# Patient Record
Sex: Female | Born: 1965 | Race: Black or African American | Hispanic: No | Smoking: Never smoker
Health system: Southern US, Community
[De-identification: ages and names within clinical notes are randomized; demographics above are authoritative.]

## PROBLEM LIST (undated history)

## (undated) DIAGNOSIS — R569 Unspecified convulsions: Secondary | ICD-10-CM

## (undated) DIAGNOSIS — F419 Anxiety disorder, unspecified: Secondary | ICD-10-CM

## (undated) DIAGNOSIS — G8929 Other chronic pain: Secondary | ICD-10-CM

## (undated) DIAGNOSIS — IMO0002 Reserved for concepts with insufficient information to code with codable children: Secondary | ICD-10-CM

## (undated) DIAGNOSIS — E119 Type 2 diabetes mellitus without complications: Secondary | ICD-10-CM

## (undated) DIAGNOSIS — M419 Scoliosis, unspecified: Secondary | ICD-10-CM

## (undated) DIAGNOSIS — K219 Gastro-esophageal reflux disease without esophagitis: Secondary | ICD-10-CM

## (undated) DIAGNOSIS — R011 Cardiac murmur, unspecified: Secondary | ICD-10-CM

## (undated) DIAGNOSIS — M797 Fibromyalgia: Secondary | ICD-10-CM

## (undated) DIAGNOSIS — I10 Essential (primary) hypertension: Secondary | ICD-10-CM

## (undated) DIAGNOSIS — R519 Headache, unspecified: Secondary | ICD-10-CM

## (undated) DIAGNOSIS — R51 Headache: Secondary | ICD-10-CM

## (undated) DIAGNOSIS — D649 Anemia, unspecified: Secondary | ICD-10-CM

## (undated) DIAGNOSIS — I639 Cerebral infarction, unspecified: Secondary | ICD-10-CM

## (undated) HISTORY — DX: Type 2 diabetes mellitus without complications: E11.9

## (undated) HISTORY — PX: ABDOMINAL HYSTERECTOMY: SHX81

## (undated) HISTORY — DX: Unspecified convulsions: R56.9

---

## 1995-03-27 DIAGNOSIS — I639 Cerebral infarction, unspecified: Secondary | ICD-10-CM

## 1995-03-27 HISTORY — DX: Cerebral infarction, unspecified: I63.9

## 1997-08-03 ENCOUNTER — Emergency Department (HOSPITAL_COMMUNITY): Admission: EM | Admit: 1997-08-03 | Discharge: 1997-08-03 | Payer: Self-pay | Admitting: Emergency Medicine

## 1997-10-07 ENCOUNTER — Inpatient Hospital Stay (HOSPITAL_COMMUNITY): Admission: AD | Admit: 1997-10-07 | Discharge: 1997-10-07 | Payer: Self-pay | Admitting: Obstetrics and Gynecology

## 1999-02-04 ENCOUNTER — Emergency Department (HOSPITAL_COMMUNITY): Admission: EM | Admit: 1999-02-04 | Discharge: 1999-02-04 | Payer: Self-pay | Admitting: Emergency Medicine

## 2001-12-14 ENCOUNTER — Emergency Department (HOSPITAL_COMMUNITY): Admission: EM | Admit: 2001-12-14 | Discharge: 2001-12-14 | Payer: Self-pay | Admitting: Emergency Medicine

## 2002-04-12 ENCOUNTER — Encounter: Payer: Self-pay | Admitting: Internal Medicine

## 2002-04-12 ENCOUNTER — Emergency Department (HOSPITAL_COMMUNITY): Admission: EM | Admit: 2002-04-12 | Discharge: 2002-04-12 | Payer: Self-pay | Admitting: Internal Medicine

## 2002-04-16 ENCOUNTER — Ambulatory Visit (HOSPITAL_COMMUNITY): Admission: RE | Admit: 2002-04-16 | Discharge: 2002-04-16 | Payer: Self-pay | Admitting: Internal Medicine

## 2002-05-15 ENCOUNTER — Emergency Department (HOSPITAL_COMMUNITY): Admission: EM | Admit: 2002-05-15 | Discharge: 2002-05-15 | Payer: Self-pay | Admitting: Emergency Medicine

## 2002-05-15 ENCOUNTER — Encounter: Payer: Self-pay | Admitting: Emergency Medicine

## 2003-02-10 ENCOUNTER — Inpatient Hospital Stay (HOSPITAL_COMMUNITY): Admission: EM | Admit: 2003-02-10 | Discharge: 2003-02-14 | Payer: Self-pay | Admitting: Emergency Medicine

## 2003-03-18 ENCOUNTER — Ambulatory Visit (HOSPITAL_COMMUNITY): Admission: RE | Admit: 2003-03-18 | Discharge: 2003-03-18 | Payer: Self-pay | Admitting: Neurology

## 2003-03-27 HISTORY — PX: LAPAROSCOPIC UNILATERAL SALPINGO OOPHERECTOMY: SHX5935

## 2003-05-03 ENCOUNTER — Ambulatory Visit (HOSPITAL_COMMUNITY): Admission: RE | Admit: 2003-05-03 | Discharge: 2003-05-03 | Payer: Self-pay | Admitting: Neurology

## 2003-09-02 ENCOUNTER — Ambulatory Visit (HOSPITAL_COMMUNITY): Admission: RE | Admit: 2003-09-02 | Discharge: 2003-09-02 | Payer: Self-pay | Admitting: General Surgery

## 2003-09-24 ENCOUNTER — Ambulatory Visit (HOSPITAL_COMMUNITY): Admission: RE | Admit: 2003-09-24 | Discharge: 2003-09-24 | Payer: Self-pay | Admitting: Family Medicine

## 2003-10-12 ENCOUNTER — Observation Stay (HOSPITAL_COMMUNITY): Admission: RE | Admit: 2003-10-12 | Discharge: 2003-10-14 | Payer: Self-pay | Admitting: General Surgery

## 2004-01-12 ENCOUNTER — Emergency Department (HOSPITAL_COMMUNITY): Admission: EM | Admit: 2004-01-12 | Discharge: 2004-01-12 | Payer: Self-pay | Admitting: Emergency Medicine

## 2004-07-19 ENCOUNTER — Emergency Department (HOSPITAL_COMMUNITY): Admission: EM | Admit: 2004-07-19 | Discharge: 2004-07-19 | Payer: Self-pay | Admitting: Emergency Medicine

## 2005-02-20 ENCOUNTER — Ambulatory Visit: Payer: Self-pay | Admitting: Psychiatry

## 2005-05-24 ENCOUNTER — Ambulatory Visit: Payer: Self-pay | Admitting: Family Medicine

## 2005-07-18 ENCOUNTER — Ambulatory Visit (HOSPITAL_COMMUNITY): Admission: RE | Admit: 2005-07-18 | Discharge: 2005-07-18 | Payer: Self-pay | Admitting: Family Medicine

## 2005-07-18 ENCOUNTER — Ambulatory Visit: Payer: Self-pay | Admitting: Family Medicine

## 2005-07-24 ENCOUNTER — Ambulatory Visit: Payer: Self-pay | Admitting: Family Medicine

## 2005-07-30 ENCOUNTER — Ambulatory Visit (HOSPITAL_COMMUNITY): Admission: RE | Admit: 2005-07-30 | Discharge: 2005-07-30 | Payer: Self-pay | Admitting: Family Medicine

## 2005-08-26 ENCOUNTER — Emergency Department (HOSPITAL_COMMUNITY): Admission: EM | Admit: 2005-08-26 | Discharge: 2005-08-26 | Payer: Self-pay | Admitting: Emergency Medicine

## 2005-08-29 ENCOUNTER — Encounter: Admission: RE | Admit: 2005-08-29 | Discharge: 2005-08-29 | Payer: Self-pay | Admitting: Family Medicine

## 2005-10-05 ENCOUNTER — Emergency Department (HOSPITAL_COMMUNITY): Admission: EM | Admit: 2005-10-05 | Discharge: 2005-10-05 | Payer: Self-pay | Admitting: Family Medicine

## 2005-11-11 ENCOUNTER — Emergency Department (HOSPITAL_COMMUNITY): Admission: EM | Admit: 2005-11-11 | Discharge: 2005-11-11 | Payer: Self-pay | Admitting: Emergency Medicine

## 2006-05-22 ENCOUNTER — Ambulatory Visit (HOSPITAL_COMMUNITY): Payer: Self-pay | Admitting: Psychiatry

## 2006-05-25 ENCOUNTER — Emergency Department (HOSPITAL_COMMUNITY): Admission: EM | Admit: 2006-05-25 | Discharge: 2006-05-26 | Payer: Self-pay | Admitting: Emergency Medicine

## 2006-06-19 ENCOUNTER — Ambulatory Visit (HOSPITAL_COMMUNITY): Payer: Self-pay | Admitting: Psychiatry

## 2006-07-18 ENCOUNTER — Ambulatory Visit (HOSPITAL_COMMUNITY): Admission: RE | Admit: 2006-07-18 | Discharge: 2006-07-18 | Payer: Self-pay | Admitting: Cardiology

## 2006-07-18 ENCOUNTER — Encounter (INDEPENDENT_AMBULATORY_CARE_PROVIDER_SITE_OTHER): Payer: Self-pay | Admitting: Cardiology

## 2006-09-04 ENCOUNTER — Emergency Department (HOSPITAL_COMMUNITY): Admission: EM | Admit: 2006-09-04 | Discharge: 2006-09-04 | Payer: Self-pay | Admitting: Family Medicine

## 2006-10-02 ENCOUNTER — Ambulatory Visit (HOSPITAL_COMMUNITY): Admission: RE | Admit: 2006-10-02 | Discharge: 2006-10-02 | Payer: Self-pay | Admitting: Cardiology

## 2007-01-02 ENCOUNTER — Emergency Department (HOSPITAL_COMMUNITY): Admission: EM | Admit: 2007-01-02 | Discharge: 2007-01-02 | Payer: Self-pay | Admitting: Emergency Medicine

## 2007-04-30 ENCOUNTER — Emergency Department (HOSPITAL_COMMUNITY): Admission: EM | Admit: 2007-04-30 | Discharge: 2007-04-30 | Payer: Self-pay | Admitting: Emergency Medicine

## 2007-05-06 ENCOUNTER — Emergency Department (HOSPITAL_COMMUNITY): Admission: EM | Admit: 2007-05-06 | Discharge: 2007-05-06 | Payer: Self-pay | Admitting: Emergency Medicine

## 2007-05-14 ENCOUNTER — Emergency Department (HOSPITAL_COMMUNITY): Admission: EM | Admit: 2007-05-14 | Discharge: 2007-05-14 | Payer: Self-pay | Admitting: Emergency Medicine

## 2007-05-15 ENCOUNTER — Encounter: Admission: RE | Admit: 2007-05-15 | Discharge: 2007-05-15 | Payer: Self-pay | Admitting: Orthopedic Surgery

## 2007-05-17 ENCOUNTER — Emergency Department (HOSPITAL_COMMUNITY): Admission: EM | Admit: 2007-05-17 | Discharge: 2007-05-17 | Payer: Self-pay | Admitting: Emergency Medicine

## 2007-06-10 ENCOUNTER — Encounter: Payer: Self-pay | Admitting: Pulmonary Disease

## 2007-07-08 ENCOUNTER — Encounter: Admission: RE | Admit: 2007-07-08 | Discharge: 2007-08-14 | Payer: Self-pay | Admitting: Orthopedic Surgery

## 2007-08-14 ENCOUNTER — Emergency Department (HOSPITAL_COMMUNITY): Admission: EM | Admit: 2007-08-14 | Discharge: 2007-08-14 | Payer: Self-pay | Admitting: Family Medicine

## 2007-10-07 ENCOUNTER — Ambulatory Visit (HOSPITAL_COMMUNITY): Admission: RE | Admit: 2007-10-07 | Discharge: 2007-10-07 | Payer: Self-pay | Admitting: Cardiology

## 2007-12-03 ENCOUNTER — Ambulatory Visit: Payer: Self-pay | Admitting: Internal Medicine

## 2007-12-03 DIAGNOSIS — G40309 Generalized idiopathic epilepsy and epileptic syndromes, not intractable, without status epilepticus: Secondary | ICD-10-CM | POA: Insufficient documentation

## 2007-12-03 DIAGNOSIS — IMO0001 Reserved for inherently not codable concepts without codable children: Secondary | ICD-10-CM | POA: Insufficient documentation

## 2007-12-17 ENCOUNTER — Ambulatory Visit: Payer: Self-pay | Admitting: Internal Medicine

## 2007-12-17 DIAGNOSIS — G4730 Sleep apnea, unspecified: Secondary | ICD-10-CM

## 2007-12-17 DIAGNOSIS — M549 Dorsalgia, unspecified: Secondary | ICD-10-CM | POA: Insufficient documentation

## 2007-12-29 ENCOUNTER — Telehealth: Payer: Self-pay | Admitting: Internal Medicine

## 2008-01-12 ENCOUNTER — Encounter: Payer: Self-pay | Admitting: Internal Medicine

## 2008-03-16 ENCOUNTER — Ambulatory Visit: Payer: Self-pay | Admitting: Internal Medicine

## 2008-04-01 ENCOUNTER — Ambulatory Visit: Payer: Self-pay | Admitting: Pulmonary Disease

## 2008-04-01 DIAGNOSIS — R51 Headache: Secondary | ICD-10-CM

## 2008-04-01 DIAGNOSIS — R519 Headache, unspecified: Secondary | ICD-10-CM | POA: Insufficient documentation

## 2008-04-01 DIAGNOSIS — I6350 Cerebral infarction due to unspecified occlusion or stenosis of unspecified cerebral artery: Secondary | ICD-10-CM | POA: Insufficient documentation

## 2008-04-01 DIAGNOSIS — G4700 Insomnia, unspecified: Secondary | ICD-10-CM

## 2008-04-13 ENCOUNTER — Encounter: Payer: Self-pay | Admitting: Internal Medicine

## 2008-04-19 ENCOUNTER — Encounter: Admission: RE | Admit: 2008-04-19 | Discharge: 2008-04-19 | Payer: Self-pay | Admitting: Neurology

## 2008-04-22 ENCOUNTER — Encounter: Admission: RE | Admit: 2008-04-22 | Discharge: 2008-07-22 | Payer: Self-pay | Admitting: Internal Medicine

## 2008-05-28 ENCOUNTER — Ambulatory Visit: Payer: Self-pay | Admitting: Internal Medicine

## 2008-05-28 DIAGNOSIS — K219 Gastro-esophageal reflux disease without esophagitis: Secondary | ICD-10-CM

## 2008-06-07 ENCOUNTER — Encounter: Payer: Self-pay | Admitting: Internal Medicine

## 2008-06-10 ENCOUNTER — Telehealth: Payer: Self-pay | Admitting: Internal Medicine

## 2008-06-11 ENCOUNTER — Ambulatory Visit: Payer: Self-pay | Admitting: Internal Medicine

## 2008-06-11 DIAGNOSIS — R197 Diarrhea, unspecified: Secondary | ICD-10-CM

## 2008-06-14 ENCOUNTER — Encounter: Payer: Self-pay | Admitting: Internal Medicine

## 2008-07-01 ENCOUNTER — Ambulatory Visit: Payer: Self-pay | Admitting: Internal Medicine

## 2008-07-01 DIAGNOSIS — N390 Urinary tract infection, site not specified: Secondary | ICD-10-CM

## 2008-07-01 LAB — CONVERTED CEMR LAB
Leukocytes, UA: NEGATIVE
Nitrite: NEGATIVE
Specific Gravity, Urine: >=1.03 (ref 1.000–1.030)
Urobilinogen, UA: 0.2 (ref 0.0–1.0)
pH: 5.5 (ref 5.0–8.0)

## 2008-10-12 ENCOUNTER — Encounter: Admission: RE | Admit: 2008-10-12 | Discharge: 2008-10-12 | Payer: Self-pay | Admitting: Family Medicine

## 2008-10-14 ENCOUNTER — Encounter: Admission: RE | Admit: 2008-10-14 | Discharge: 2008-10-14 | Payer: Self-pay | Admitting: Specialist

## 2008-10-20 ENCOUNTER — Encounter: Admission: RE | Admit: 2008-10-20 | Discharge: 2008-10-20 | Payer: Self-pay | Admitting: Specialist

## 2008-10-31 ENCOUNTER — Emergency Department (HOSPITAL_COMMUNITY): Admission: EM | Admit: 2008-10-31 | Discharge: 2008-11-01 | Payer: Self-pay | Admitting: Emergency Medicine

## 2008-11-17 ENCOUNTER — Encounter: Admission: RE | Admit: 2008-11-17 | Discharge: 2008-12-20 | Payer: Self-pay | Admitting: Specialist

## 2009-03-01 ENCOUNTER — Ambulatory Visit (HOSPITAL_COMMUNITY): Admission: RE | Admit: 2009-03-01 | Discharge: 2009-03-01 | Payer: Self-pay | Admitting: Family Medicine

## 2009-03-28 ENCOUNTER — Emergency Department (HOSPITAL_COMMUNITY): Admission: EM | Admit: 2009-03-28 | Discharge: 2009-03-28 | Payer: Self-pay | Admitting: Family Medicine

## 2009-04-18 ENCOUNTER — Emergency Department (HOSPITAL_COMMUNITY): Admission: EM | Admit: 2009-04-18 | Discharge: 2009-04-18 | Payer: Self-pay | Admitting: Emergency Medicine

## 2009-09-02 ENCOUNTER — Emergency Department (HOSPITAL_COMMUNITY): Admission: EM | Admit: 2009-09-02 | Discharge: 2009-09-02 | Payer: Self-pay | Admitting: Emergency Medicine

## 2009-12-04 ENCOUNTER — Emergency Department (HOSPITAL_COMMUNITY): Admission: EM | Admit: 2009-12-04 | Discharge: 2009-12-04 | Payer: Self-pay | Admitting: Emergency Medicine

## 2010-02-13 ENCOUNTER — Encounter: Admission: RE | Admit: 2010-02-13 | Discharge: 2010-02-13 | Payer: Self-pay | Admitting: Family Medicine

## 2010-02-19 ENCOUNTER — Emergency Department (HOSPITAL_COMMUNITY): Admission: EM | Admit: 2010-02-19 | Discharge: 2010-02-19 | Payer: Self-pay | Admitting: Emergency Medicine

## 2010-03-02 ENCOUNTER — Encounter: Admission: RE | Admit: 2010-03-02 | Payer: Self-pay | Source: Home / Self Care | Admitting: Family Medicine

## 2010-03-02 ENCOUNTER — Ambulatory Visit (HOSPITAL_COMMUNITY)
Admission: RE | Admit: 2010-03-02 | Discharge: 2010-03-02 | Payer: Self-pay | Source: Home / Self Care | Attending: Family Medicine | Admitting: Family Medicine

## 2010-04-17 ENCOUNTER — Encounter: Payer: Self-pay | Admitting: Specialist

## 2010-06-08 LAB — URINALYSIS, ROUTINE W REFLEX MICROSCOPIC
Glucose, UA: NEGATIVE mg/dL
Hgb urine dipstick: NEGATIVE
Ketones, ur: NEGATIVE mg/dL
Protein, ur: NEGATIVE mg/dL
pH: 6 (ref 5.0–8.0)

## 2010-06-11 LAB — URINALYSIS, ROUTINE W REFLEX MICROSCOPIC
Glucose, UA: 250 mg/dL — AB
Ketones, ur: >80 mg/dL — AB
Nitrite: POSITIVE — AB
Specific Gravity, Urine: 1.023 (ref 1.005–1.030)
pH: 5 (ref 5.0–8.0)

## 2010-06-11 LAB — URINE MICROSCOPIC-ADD ON

## 2010-07-01 LAB — CBC
HCT: 37.2 % (ref 36.0–46.0)
Platelets: 282 10*3/uL (ref 150–400)
WBC: 14.4 10*3/uL — ABNORMAL HIGH (ref 4.0–10.5)

## 2010-07-01 LAB — URINALYSIS, ROUTINE W REFLEX MICROSCOPIC
Glucose, UA: NEGATIVE mg/dL
Protein, ur: NEGATIVE mg/dL

## 2010-07-01 LAB — DIFFERENTIAL
Eosinophils Relative: 1 % (ref 0–5)
Lymphocytes Relative: 27 % (ref 12–46)
Lymphs Abs: 4 10*3/uL (ref 0.7–4.0)
Neutrophils Relative %: 69 % (ref 43–77)

## 2010-07-01 LAB — POCT I-STAT, CHEM 8
BUN: 13 mg/dL (ref 6–23)
Chloride: 102 mEq/L (ref 96–112)
Creatinine, Ser: 1 mg/dL (ref 0.4–1.2)
Potassium: 4.1 mEq/L (ref 3.5–5.1)
Sodium: 136 mEq/L (ref 135–145)

## 2010-08-10 ENCOUNTER — Other Ambulatory Visit: Payer: Self-pay | Admitting: Obstetrics and Gynecology

## 2010-08-10 ENCOUNTER — Encounter: Payer: Medicare Other | Admitting: Obstetrics and Gynecology

## 2010-08-10 DIAGNOSIS — Z01419 Encounter for gynecological examination (general) (routine) without abnormal findings: Secondary | ICD-10-CM

## 2010-08-10 DIAGNOSIS — Z124 Encounter for screening for malignant neoplasm of cervix: Secondary | ICD-10-CM

## 2010-08-10 DIAGNOSIS — Z113 Encounter for screening for infections with a predominantly sexual mode of transmission: Secondary | ICD-10-CM

## 2010-08-11 NOTE — Discharge Summary (Signed)
NAME:  Helen [name], Helen [name]                          ACCOUNT NO.:  [id number]   MEDICAL RECORD NO.:  [id number]                   PATIENT TYPE:  INP   LOCATION:  A339                                 FACILITY:  [facility]   PHYSICIAN:  Tesfaye D. Felecia Shelling, M.D.              DATE OF BIRTH:  03-25-1966   DATE OF ADMISSION:  02/10/2003  DATE OF DISCHARGE:  02/14/2003                                 DISCHARGE SUMMARY   DISCHARGE DIAGNOSES:  1. Probably new cerebrovascular accident with left-sided weakness and     numbness.  2. History of previous cerebrovascular accident.  3. Migraine headaches.   DISCHARGE MEDICATIONS:  1. Lortab 5/500 one tablet p.o. q.6h. p.r.n.  2. Robaxin 750 mg p.o. t.i.d.   DISPOSITION:  The patient was discharged home in stable condition.   HOSPITAL COURSE:  This is a 45 year old female patient with a history of  previous CVA who came to the emergency room with complaint of left-sided  weakness and numbness.  The patient was admitted and evaluated by her  neurologist.  CT scan was negative.  There was a suspicion that she had a  mild new stroke.  The possibility of migraine headache also entertained.  The patient remained stable, and she was discharged home with  symptomatic  treatment and to be followed by her neurologist.     ___________________________________________                                         Eustaquio Maize. Felecia Shelling, M.D.   TDF/MEDQ  D:  03/23/2003  T:  03/23/2003  Job:  [id number]

## 2010-08-11 NOTE — H&P (Signed)
NAME:  Helen [name], Helen [name]                          ACCOUNT NO.:  [id number]   MEDICAL RECORD NO.:  [id number]                   PATIENT TYPE:  AMB   LOCATION:  DAY                                  FACILITY:  [facility]   PHYSICIAN:  Jerolyn Shin C. Katrinka Blazing, M.D.                DATE OF BIRTH:  [birth year]   DATE OF ADMISSION:  10/11/2003  DATE OF DISCHARGE:                                HISTORY & PHYSICAL   HISTORY OF PRESENT ILLNESS:  A 45 year old female with a history of  recurrent severe right lower quadrant pain with nausea. She had intermittent  vaginal spotting. She is status post hysterectomy but has retained ovaries.  Ultrasound shows normal left ovary with an enlarged right ovary, which  measures 4.5 x 3.6 x 3.6 cm with a 3.1 x 2.6 x 2.6 complex hypoechoic mass.  This mass is suspicious for neoplasm. She is scheduled for laparoscopy with  oophorectomy.   PAST MEDICAL HISTORY:  She has a history of a cerebrovascular accident in  79 and has recovered. There is a history of hypertension. Lumbar disk  disease. Anemia and recurrent severe headaches.   MEDICATIONS:  Maxalt p.r.n. for headaches, hydrochlorothiazide 12.5 mg q.d.  and Robaxin 750 mg t.i.d.   FAMILY HISTORY:  Positive for hypertension, diabetes mellitus, breast  cancer, and prostate cancer.   PHYSICAL EXAMINATION:  VITAL SIGNS:  Blood pressure 115/80, pulse 72,  respiratory rate 18, weight 222 pounds.  HEENT:  Unremarkable.  NECK:  Supple. No jugular venous distention. No bruit.  CHEST:  Clear to auscultation. No rales, rubs, or wheezes.  HEART:  Regular rate and rhythm. Without murmur, rub, or gallop.  ABDOMEN:  Soft with mild hypogastric tenderness.  EXTREMITIES:  No clubbing, cyanosis, or edema.  PELVIC:  Unremarkable except for tender mass in right adnexa.   IMPRESSION:  1. Right ovarian mass.  2. Hypertension.  3. Chronic headache.  4. Lumbar disk disease.   PLAN:  Laparoscopy with right oophorectomy.     ___________________________________________                                         Dirk Dress Katrinka Blazing, M.D.   LCS/MEDQ  D:  10/11/2003  T:  10/12/2003  Job:  [id number]

## 2010-08-11 NOTE — Consult Note (Signed)
[name], [name]                          ACCOUNT NO.:  [id number]   MEDICAL RECORD NO.:  [id number]                   PATIENT TYPE:  INP   LOCATION:  A339                                 FACILITY:  [facility]   PHYSICIAN:  Kofi A. Gerilyn Pilgrim, M.D.              DATE OF BIRTH:  03/15/1966   DATE OF CONSULTATION:  DATE OF DISCHARGE:                                   CONSULTATION   IMPRESSION:  I believe this is a case of patient who likely has multiple  diagnoses to explain her symptoms.  I believe she has carpal tunnel syndrome  on the left.  Additionally, she has significant low back pain with a  radicular quality to this, somewhat suggestive of a radiculopathy.  Her  other diagnosis is migraine headaches.  Although her symptoms are all on the  left side, her physical exam and MRI of the brain do not suggest a central  process.  I also do not see any indication of spinal cord dysfunction on  physical exam.   RECOMMENDATIONS:  Suggest that she use a hand brace on the left.  EMG also  will be set up in our office for possibly tomorrow or next week.  If she has  not improved in the legs within a month, EMG of the legs would be suggested  also.  Robaxin could be used for symptomatic.  Additionally, a nonsteroidal  anti-inflammatory medication could also be used.   HISTORY:  This is a 45 year old right-handed African-American lady who  apparently has a diagnosis of stroke which happened in 1996.  She  apparently at that time had similar symptoms to what she has today, with  paresthesias with numbness involving the left hand and left leg.  She was  symptomatic for two weeks before she sought medical attention.  On this bout  she apparently was lifting her son, who is apparently 50 pounds, when she  felt a sharp pain in her back.  The following day she noticed more back pain  which shot down the leg.  She also reports some numbness and tingling in the  left leg.  At the same time she  developed similar symptoms in the left upper  extremity starting in the left hand and progressing proximally.  It is not  entirely clear if she had similar symptoms in the face, but she does report  some twitching of the left facial region.  She reports that the main reason  why she came to the hospital was because of the pain in the low back region.  She requests symptomatic treatment for this.  The patient endorses having a  headache with her current symptoms.  It is located in the left periorbital  temporal region.  It is associated with some phonophobia but no photophobia,  no nausea, no emesis.  She does have some blurry vision with her headaches.  She does endorse tinnitus.  She also complains of problems with urination  over the past week or two.  She reports urinary urgency.   PAST MEDICAL HISTORY:  Essentially unremarkable other than stated in history  of present illness.   FAMILY HISTORY:  Positive for stroke.   SOCIAL HISTORY:  She has recently gone through a separation and is having  stress from that.   REVIEW OF SYSTEMS:  No speech impairments reported.  No fevers.  No chills.  Otherwise unremarkable other than stated in the history of present illness.   PHYSICAL EXAMINATION:  VITAL SIGNS:  Temperature 98.4, pulse 70,  respirations in the teens, blood pressure 102/64.  HEENT, NECK:  Supple.  GENERAL:  Shows an obese lady in no acute distress.  NEUROLOGIC:  She is awake.  She is alert.  She converses fluently,  coherently.  There is no language impairment.  There is no obvious cognitive  impairment.  Cranial nerve evaluation shows the pupils are 3-4 mm and  reactive.  Visual fields are intact.  Extraocular movements are full.  There  is no nystagmus.  Facial muscles are normal.  Facial sensation indicates  mild decreased sensation in the left facial region.  Trachea is midline.  Uvula is midline.  Shoulder shrugs are normal.  Motor examination shows mild  give-away  weakness, 5- to 4+/5, in the left leg.  Other extremities are  normal.  There is no pronator drift.  Coordination is unremarkable.  Reflexes are actually +3, but plantar reflexes are both downgoing.  Sensory  examination shows actually reduced sensation to light touch on her left  side.  Interestingly, however, there is reduced sensation on the right side  to temperature.  Position sense is normal.  Gait is slightly antalgic.   MRI of the brain showed no abnormalities, done without contrast.   Thank you for this consultation.      ___________________________________________                                            Perlie Gold Gerilyn Pilgrim, M.D.   KAD/MEDQ  D:  02/11/2003  T:  02/11/2003  Job:  [id number]

## 2010-08-11 NOTE — Op Note (Signed)
NAME:  Helen [name], Helen [name]                          ACCOUNT NO.:  [id number]   MEDICAL RECORD NO.:  [id number]                   PATIENT TYPE:  INP   LOCATION:  A420                                 FACILITY:  [facility]   PHYSICIAN:  Dirk Dress. Katrinka Blazing, M.D.                DATE OF BIRTH:  07/31/1965   DATE OF PROCEDURE:  DATE OF DISCHARGE:                                 OPERATIVE REPORT   PREOPERATIVE DIAGNOSIS:  Right ovarian mass.   POSTOPERATIVE DIAGNOSIS:  Right ovarian mass.   PROCEDURE:  Laparoscopic right salpingo-oophorectomy.   SURGEON:  Dirk Dress. Katrinka Blazing, M.D.   DESCRIPTION:  Under general anesthesia the patient's abdomen was prepped and  draped in a sterile field.  A supraumbilical midline incision was made and a  Veress needle was inserted uneventfully.  Abdomen was insufflated with 2.5  liters of CO2.  Using a Vis-A-Port guide a 10-mm port was placed  uneventfully.  There were some adhesions in the lower midline.  The patient  was placed in deep Trendelenburg position.  Under videoscopic guidance a 5-  mm port was placed in the lower suprapubic midline and a 12-mm port was  placed in the left lower quadrant.  The pelvis was clear except for a small  amount of serous fluid.  The right ovary was enlarged and there appeared to  be a hemorrhagic cyst extending from the right ovary.  The tube was inflamed  and enlarged and friable.  The left ovary was small, but was densely  involved in adhesions from the sigmoid colon.  The right ovary was grasped  with the grasping forceps.  Using electrocautery and blunt dissection the  ovarian tissue and the tube were separated from the right lateral pelvic  wall.  The vascular pedicle was identified.  It was dissected and then  transected using the endovascular GIA.  The remainder of the attachments to  the peritoneum were clipped with hemoclips and divided.  The ovary was  delivered in an EndoCatch device.   Evaluation revealed no evidence of  vascular injury.  There was no bleeding.  The pelvis was irrigated. CO2 was allowed to escape from the abdomen and the  ports were removed.  The supraumbilical port midline fascia was closed with  #0 Dexon.  The skin and subcutaneous tissue of the other ports were then  closed with staples. Dressings were placed.  The patient tolerated the  procedure well.  She was awakened from anesthesia transferred to a bed and  taken to the postanesthetic care unit for further monitoring.      ___________________________________________                                            Dirk Dress. Katrinka Blazing, M.D.   LCS/MEDQ  D:  10/12/2003  T:  10/12/2003  Job:  [id number]

## 2010-08-11 NOTE — Group Therapy Note (Signed)
NAME:  Helen [name], Helen [name] NO.:  [id number]  MEDICAL RECORD NO.:  [id number]           PATIENT TYPE:  A  LOCATION:  [facility]                   FACILITY:  [facility]  PHYSICIAN:  Argentina Donovan, MD        DATE OF BIRTH:  1965-05-30  DATE OF SERVICE:  08/10/2010                                 CLINIC NOTE  The patient is a 45 year old gravida 2, para 2-0-0-2 with children ages 75 and 23 who has been a patient of family practice at [facility] in for an annual examination.  She had a mammogram done in March 2012 which was normal.  She cannot remember when she last had a Pap smear.  She states she had a stroke in 1996.  Medication she is on Flexeril for fibromyalgia, etodolac antiinflammatory for painful joints and low-dose baby aspirin once a day.  Other than that she is on no medications.  Her blood pressure is 121/81.  She weighs 241 pounds.  She is 5 feet 3 inches tall.  She has no known allergies and she has had a hysterectomy done.  On examination, abdomen is soft, flat, nontender. No masses.  No organomegaly.  External genitalia is normal.  BUS is within normal limits.  Vagina is clean and well rugated.  The cervix is clean and parous.  Uterus and adnexa could not be palpated.  Pap smear was taken.  The patient desired to be checked for sexually transmitted disease.  Therefore, a wet prep, gonorrhea and Chlamydia probes were done and the blood work will be done for hepatitis B, C, HIV and RPR.  IMPRESSION:  Normal gynecological examination status hysterectomy.          ______________________________ Argentina Donovan, MD    PR/MEDQ  D:  08/10/2010  T:  08/11/2010  Job:  [id number]

## 2010-08-11 NOTE — Procedures (Signed)
Helen [name], Helen [name]                          ACCOUNT NO.:  [id number]   MEDICAL RECORD NO.:  [id number]                   PATIENT TYPE:  INP   LOCATION:  A339                                 FACILITY:  [facility]   PHYSICIAN:  Mora Bing, M.D.               DATE OF BIRTH:  October 29, 1965   DATE OF PROCEDURE:  02/11/2003  DATE OF DISCHARGE:                                  ECHOCARDIOGRAM   REFERRING PHYSICIAN:  1. Dr. Felecia Shelling.  2. Dr. Juanetta Gosling.   CLINICAL DATA:  69. A 45 year old woman with cerebrovascular accident.  2. Possible history of congenital heart disease.   M-MODE:  1. Aorta 2.9.  2. Left atrium 3.3.  3. Septum 1.0.  4. Posterior wall 0.8.  5. LV diastole 4.4.  6. LV systole 3.7.   FINDINGS:  1. Technically-adequate echocardiographic study.  2. Normal left atrium, right atrium and right ventricle.  3. Normal aorta, tricuspid and pulmonic valves.  4. Trivial tricuspid regurgitation.  5. Slight mitral valve thickening with minimal regurgitation.  6. Normal internal dimension of the left ventricle; no LVH.  7. Regional and global LV systolic function are low normal.  8. Normal inferior vena cava.  9. No Atrial septal defect or ventricular septal defect identified.  10.      Injection of agitated saline might be of benefit, if the     possibility of atrial septal defect is significant.      ___________________________________________                                            Ruston Bing, M.D.   RR/MEDQ  D:  02/11/2003  T:  02/12/2003  Job:  [id number]

## 2010-10-07 ENCOUNTER — Emergency Department (HOSPITAL_COMMUNITY)
Admission: EM | Admit: 2010-10-07 | Discharge: 2010-10-08 | Disposition: A | Discharge status: Home / Self Care | Payer: Medicare Other | Attending: Emergency Medicine | Admitting: Emergency Medicine

## 2010-10-07 DIAGNOSIS — R05 Cough: Secondary | ICD-10-CM | POA: Insufficient documentation

## 2010-10-07 DIAGNOSIS — E7800 Pure hypercholesterolemia, unspecified: Secondary | ICD-10-CM | POA: Insufficient documentation

## 2010-10-07 DIAGNOSIS — J40 Bronchitis, not specified as acute or chronic: Secondary | ICD-10-CM | POA: Insufficient documentation

## 2010-10-07 DIAGNOSIS — R011 Cardiac murmur, unspecified: Secondary | ICD-10-CM | POA: Insufficient documentation

## 2010-10-07 DIAGNOSIS — J9801 Acute bronchospasm: Secondary | ICD-10-CM | POA: Insufficient documentation

## 2010-10-07 DIAGNOSIS — IMO0001 Reserved for inherently not codable concepts without codable children: Secondary | ICD-10-CM | POA: Insufficient documentation

## 2010-10-07 DIAGNOSIS — R059 Cough, unspecified: Secondary | ICD-10-CM | POA: Insufficient documentation

## 2010-10-07 DIAGNOSIS — E669 Obesity, unspecified: Secondary | ICD-10-CM | POA: Insufficient documentation

## 2010-11-22 ENCOUNTER — Ambulatory Visit (HOSPITAL_COMMUNITY)
Admission: RE | Admit: 2010-11-22 | Discharge: 2010-11-22 | Disposition: A | Discharge status: Home / Self Care | Payer: Medicare Other | Source: Ambulatory Visit | Attending: Internal Medicine | Admitting: Internal Medicine

## 2010-11-22 ENCOUNTER — Other Ambulatory Visit (HOSPITAL_COMMUNITY): Payer: Self-pay | Admitting: Internal Medicine

## 2010-11-22 DIAGNOSIS — W19XXXA Unspecified fall, initial encounter: Secondary | ICD-10-CM

## 2010-11-22 DIAGNOSIS — M25559 Pain in unspecified hip: Secondary | ICD-10-CM | POA: Insufficient documentation

## 2010-12-15 LAB — POCT CARDIAC MARKERS
Myoglobin, poc: 84.1
Operator id: 257131
Operator id: 284251

## 2010-12-15 LAB — CBC
HCT: 36.7
Hemoglobin: 12.1
MCHC: 33.1
MCV: 83
RBC: 4.42

## 2010-12-15 LAB — I-STAT 8, (EC8 V) (CONVERTED LAB)
BUN: 11
Chloride: 106
HCT: 40
Operator id: 284251
Potassium: 4.4
pCO2, Ven: 44.3 — ABNORMAL LOW
pH, Ven: 7.361 — ABNORMAL HIGH

## 2010-12-15 LAB — DIFFERENTIAL
Basophils Relative: 1
Eosinophils Absolute: 0.1
Monocytes Absolute: 0.4
Monocytes Relative: 5

## 2011-01-04 LAB — POCT RAPID STREP A: Streptococcus, Group A Screen (Direct): NEGATIVE

## 2011-01-15 ENCOUNTER — Emergency Department (HOSPITAL_COMMUNITY): Payer: Medicare Other

## 2011-01-15 ENCOUNTER — Emergency Department (HOSPITAL_COMMUNITY)
Admission: EM | Admit: 2011-01-15 | Discharge: 2011-01-15 | Disposition: A | Discharge status: Home / Self Care | Payer: Medicare Other | Attending: Emergency Medicine | Admitting: Emergency Medicine

## 2011-01-15 DIAGNOSIS — IMO0001 Reserved for inherently not codable concepts without codable children: Secondary | ICD-10-CM | POA: Insufficient documentation

## 2011-01-15 DIAGNOSIS — E7800 Pure hypercholesterolemia, unspecified: Secondary | ICD-10-CM | POA: Insufficient documentation

## 2011-01-15 DIAGNOSIS — F411 Generalized anxiety disorder: Secondary | ICD-10-CM | POA: Insufficient documentation

## 2011-01-15 DIAGNOSIS — IMO0002 Reserved for concepts with insufficient information to code with codable children: Secondary | ICD-10-CM | POA: Insufficient documentation

## 2011-01-15 DIAGNOSIS — R55 Syncope and collapse: Secondary | ICD-10-CM | POA: Insufficient documentation

## 2011-01-15 DIAGNOSIS — Z8673 Personal history of transient ischemic attack (TIA), and cerebral infarction without residual deficits: Secondary | ICD-10-CM | POA: Insufficient documentation

## 2011-01-15 DIAGNOSIS — E669 Obesity, unspecified: Secondary | ICD-10-CM | POA: Insufficient documentation

## 2011-01-15 DIAGNOSIS — R4182 Altered mental status, unspecified: Secondary | ICD-10-CM | POA: Insufficient documentation

## 2011-01-15 LAB — CBC
Hemoglobin: 11.7 g/dL — ABNORMAL LOW (ref 12.0–15.0)
MCH: 27.7 pg (ref 26.0–34.0)
MCHC: 31.9 g/dL (ref 30.0–36.0)
Platelets: 315 10*3/uL (ref 150–400)
RDW: 14.7 % (ref 11.5–15.5)

## 2011-01-15 LAB — POCT I-STAT, CHEM 8
Calcium, Ion: 1.22 mmol/L (ref 1.12–1.32)
Creatinine, Ser: 0.9 mg/dL (ref 0.50–1.10)
Glucose, Bld: 72 mg/dL (ref 70–99)
HCT: 37 % (ref 36.0–46.0)
Hemoglobin: 12.6 g/dL (ref 12.0–15.0)
Potassium: 4.1 mEq/L (ref 3.5–5.1)
TCO2: 24 mmol/L (ref 0–100)

## 2011-01-15 LAB — CARBOXYHEMOGLOBIN
Carboxyhemoglobin: 1 % (ref 0.5–1.5)
O2 Saturation: 99.3 %
Total hemoglobin: 11.7 g/dL — ABNORMAL LOW (ref 12.5–16.0)

## 2011-01-19 ENCOUNTER — Other Ambulatory Visit (HOSPITAL_COMMUNITY): Payer: Self-pay | Admitting: Nurse Practitioner

## 2011-01-19 DIAGNOSIS — Z1231 Encounter for screening mammogram for malignant neoplasm of breast: Secondary | ICD-10-CM

## 2011-03-05 ENCOUNTER — Ambulatory Visit (HOSPITAL_COMMUNITY)
Admission: RE | Admit: 2011-03-05 | Discharge: 2011-03-05 | Disposition: A | Discharge status: Home / Self Care | Payer: Medicare Other | Source: Ambulatory Visit | Attending: Nurse Practitioner | Admitting: Nurse Practitioner

## 2011-03-05 DIAGNOSIS — Z1231 Encounter for screening mammogram for malignant neoplasm of breast: Secondary | ICD-10-CM | POA: Insufficient documentation

## 2011-05-12 ENCOUNTER — Encounter (HOSPITAL_COMMUNITY): Payer: Self-pay | Admitting: *Deleted

## 2011-05-12 ENCOUNTER — Emergency Department (HOSPITAL_COMMUNITY)
Admission: EM | Admit: 2011-05-12 | Discharge: 2011-05-12 | Disposition: A | Discharge status: Home / Self Care | Payer: PRIVATE HEALTH INSURANCE | Attending: Emergency Medicine | Admitting: Emergency Medicine

## 2011-05-12 ENCOUNTER — Emergency Department (HOSPITAL_COMMUNITY): Payer: PRIVATE HEALTH INSURANCE

## 2011-05-12 DIAGNOSIS — R062 Wheezing: Secondary | ICD-10-CM | POA: Insufficient documentation

## 2011-05-12 DIAGNOSIS — J189 Pneumonia, unspecified organism: Secondary | ICD-10-CM

## 2011-05-12 DIAGNOSIS — K219 Gastro-esophageal reflux disease without esophagitis: Secondary | ICD-10-CM | POA: Insufficient documentation

## 2011-05-12 DIAGNOSIS — R0609 Other forms of dyspnea: Secondary | ICD-10-CM | POA: Insufficient documentation

## 2011-05-12 DIAGNOSIS — Z79899 Other long term (current) drug therapy: Secondary | ICD-10-CM | POA: Insufficient documentation

## 2011-05-12 DIAGNOSIS — F411 Generalized anxiety disorder: Secondary | ICD-10-CM | POA: Insufficient documentation

## 2011-05-12 DIAGNOSIS — R0602 Shortness of breath: Secondary | ICD-10-CM | POA: Insufficient documentation

## 2011-05-12 DIAGNOSIS — R0989 Other specified symptoms and signs involving the circulatory and respiratory systems: Secondary | ICD-10-CM | POA: Insufficient documentation

## 2011-05-12 DIAGNOSIS — Z8673 Personal history of transient ischemic attack (TIA), and cerebral infarction without residual deficits: Secondary | ICD-10-CM | POA: Insufficient documentation

## 2011-05-12 DIAGNOSIS — IMO0002 Reserved for concepts with insufficient information to code with codable children: Secondary | ICD-10-CM | POA: Insufficient documentation

## 2011-05-12 HISTORY — DX: Anxiety disorder, unspecified: F41.9

## 2011-05-12 HISTORY — DX: Scoliosis, unspecified: M41.9

## 2011-05-12 HISTORY — DX: Gastro-esophageal reflux disease without esophagitis: K21.9

## 2011-05-12 HISTORY — DX: Reserved for concepts with insufficient information to code with codable children: IMO0002

## 2011-05-12 HISTORY — DX: Cardiac murmur, unspecified: R01.1

## 2011-05-12 HISTORY — DX: Cerebral infarction, unspecified: I63.9

## 2011-05-12 HISTORY — DX: Fibromyalgia: M79.7

## 2011-05-12 MED ORDER — ALBUTEROL SULFATE HFA 108 (90 BASE) MCG/ACT IN AERS
2.0000 | INHALATION_SPRAY | Freq: Four times a day (QID) | RESPIRATORY_TRACT | Status: DC
  Administered 2011-05-12: 2 via RESPIRATORY_TRACT
  Filled 2011-05-12: qty 6.7

## 2011-05-12 MED ORDER — METHYLPREDNISOLONE SODIUM SUCC 125 MG IJ SOLR
INTRAMUSCULAR | Status: AC
  Filled 2011-05-12: qty 2

## 2011-05-12 MED ORDER — GUAIFENESIN ER 1200 MG PO TB12: 1.0000 | ORAL_TABLET | Freq: Two times a day (BID) | ORAL | Status: DC

## 2011-05-12 MED ORDER — ALBUTEROL SULFATE (5 MG/ML) 0.5% IN NEBU
INHALATION_SOLUTION | RESPIRATORY_TRACT | Status: AC
  Administered 2011-05-12: 5 mg via RESPIRATORY_TRACT
  Filled 2011-05-12: qty 1

## 2011-05-12 MED ORDER — IPRATROPIUM BROMIDE 0.02 % IN SOLN
RESPIRATORY_TRACT | Status: AC
  Administered 2011-05-12: 0.5 mg
  Filled 2011-05-12: qty 2.5

## 2011-05-12 MED ORDER — ALBUTEROL (5 MG/ML) CONTINUOUS INHALATION SOLN
10.0000 mg/h | INHALATION_SOLUTION | RESPIRATORY_TRACT | Status: DC
  Administered 2011-05-12: 5 mg via RESPIRATORY_TRACT

## 2011-05-12 MED ORDER — AZITHROMYCIN 250 MG PO TABS
500.0000 mg | ORAL_TABLET | Freq: Once | ORAL | Status: AC
  Administered 2011-05-12: 500 mg via ORAL
  Filled 2011-05-12: qty 2

## 2011-05-12 MED ORDER — ALBUTEROL SULFATE (5 MG/ML) 0.5% IN NEBU
INHALATION_SOLUTION | RESPIRATORY_TRACT | Status: AC
  Filled 2011-05-12: qty 2

## 2011-05-12 MED ORDER — AZITHROMYCIN 250 MG PO TABS: 250.0000 mg | ORAL_TABLET | Freq: Every day | ORAL | Status: AC

## 2011-05-12 MED ORDER — ONDANSETRON HCL 4 MG/2ML IJ SOLN
4.0000 mg | Freq: Once | INTRAMUSCULAR | Status: AC
  Administered 2011-05-12: 4 mg via INTRAVENOUS
  Filled 2011-05-12: qty 2

## 2011-05-12 MED ORDER — METHYLPREDNISOLONE SODIUM SUCC 125 MG IJ SOLR
125.0000 mg | Freq: Once | INTRAMUSCULAR | Status: AC
  Administered 2011-05-12: 125 mg via INTRAVENOUS

## 2011-05-12 MED ORDER — PROMETHAZINE-DM 6.25-15 MG/5ML PO SYRP: 5.0000 mL | ORAL_SOLUTION | Freq: Four times a day (QID) | ORAL | Status: AC | PRN

## 2011-05-12 MED ORDER — DEXTROSE 5 % IV SOLN
1.0000 g | Freq: Once | INTRAVENOUS | Status: AC
  Administered 2011-05-12: 1 g via INTRAVENOUS
  Filled 2011-05-12: qty 10

## 2011-05-12 NOTE — Discharge Instructions (Signed)
Return here for any worsening in your condition. Increase your fluid intake. °

## 2011-05-12 NOTE — ED Provider Notes (Signed)
Medical screening examination/treatment/procedure(s) were performed by non-physician practitioner and as supervising physician I was immediately available for consultation/collaboration.   Pawan Knechtel, MD 05/12/11 1244 

## 2011-05-12 NOTE — ED Notes (Signed)
Reports waking up this am with sob and upper airway congestion, denies hx of asthma, spo2 96% on room air.

## 2011-05-12 NOTE — ED Notes (Signed)
Pt reports sob, feeling "feverish," hoarse, aching feeling, chest heaviness, frontal throat swelling, and dry cough x2 days. Pt reports her heat has not been working correctly in her house for last few days.

## 2011-05-12 NOTE — ED Provider Notes (Signed)
History     CSN: [id number]  Arrival date & time 05/12/11  4098   First MD Initiated Contact with Patient 05/12/11 0740      Chief Complaint  Patient presents with  . Shortness of Breath  . Wheezing    (Consider location/radiation/quality/duration/timing/severity/associated sxs/prior treatment) HPI Patient is 46 yo [place] American female with history of fibromyalgia and stroke who presents to the ED this morning complaining of shortness of breath. She said when she woke this morning she was having trouble breathing. She denies history of asthma, but states that she was in the ED about 2 months ago with the same problem and was given a breathing treatment. Patient appears to be in mild distress with audible breath sounds/gasps. She denies N/V/D, chest pain, headache, lightheadedness, fevers, chills, sweats. Past Medical History  Diagnosis Date  . Fibromyalgia   . Stroke   . Acid reflux   . Degenerative disc disease   . Scoliosis   . Heart murmur   . Anxiety     History reviewed. No pertinent past surgical history.  History reviewed. No pertinent family history.  History  Substance Use Topics  . Smoking status: Not on file  . Smokeless tobacco: Not on file  . Alcohol Use: No    OB History    Grav Para Term Preterm Abortions TAB SAB Ect Mult Living                  Review of Systems All pertinent positives and negatives reviewed in the history of present illness  Allergies  Milnacipran; Pregabalin; Topiramate; and Metronidazole  Home Medications   Current Outpatient Rx  Name Route Sig Dispense Refill  . ASPIRIN 81 MG PO CHEW Oral Chew 81 mg by mouth daily.    . CYCLOBENZAPRINE HCL 10 MG PO TABS Oral Take 10 mg by mouth 3 (three) times daily as needed. For spasms    . ETODOLAC 500 MG PO TABS Oral Take 500 mg by mouth 2 (two) times daily.      BP 134/99  Pulse 111  Temp(Src) 98.3 F (36.8 C) (Oral)  Resp 22  SpO2 96%  Physical Exam  Constitutional: She  appears well-developed and well-nourished. She appears distressed.  HENT:  Head: Normocephalic and atraumatic.  Cardiovascular: Normal rate and regular rhythm.   Pulmonary/Chest: She is in respiratory distress. She has wheezes. She exhibits no tenderness.  Skin: She is not diaphoretic.    ED Course  Procedures (including critical care time)     The patient is feeling much improved and would like to go home at this time. She is advised to return here for any worsening in her condition. The patient is also advised to increase her fluids. She was given IV Rocephin here in the ER. She states that her breathing is vastly improved since her treatment.   MDM   MDM Reviewed: nursing note and vitals Interpretation: x-ray           Carlyle Dolly, PA-C 05/12/11 1227

## 2011-05-12 NOTE — ED Notes (Signed)
Pt reports waking this am w/sob, wheezing, productive cough w/white colored sputum, and chest wall pain, pt describes her chest pain as a dull sensation, rates her pain 10, pt reports taking her albuterol inhaler w/no relief.

## 2012-03-04 ENCOUNTER — Other Ambulatory Visit (HOSPITAL_COMMUNITY): Payer: Self-pay | Admitting: Family Medicine

## 2012-03-04 DIAGNOSIS — Z1231 Encounter for screening mammogram for malignant neoplasm of breast: Secondary | ICD-10-CM

## 2012-03-21 ENCOUNTER — Ambulatory Visit (HOSPITAL_COMMUNITY)
Admission: RE | Admit: 2012-03-21 | Discharge: 2012-03-21 | Disposition: A | Discharge status: Home / Self Care | Payer: PRIVATE HEALTH INSURANCE | Source: Ambulatory Visit | Attending: Family Medicine | Admitting: Family Medicine

## 2012-03-21 DIAGNOSIS — Z1231 Encounter for screening mammogram for malignant neoplasm of breast: Secondary | ICD-10-CM | POA: Insufficient documentation

## 2013-04-12 ENCOUNTER — Emergency Department (HOSPITAL_COMMUNITY)
Admission: EM | Admit: 2013-04-12 | Discharge: 2013-04-12 | Disposition: A | Discharge status: Home / Self Care | Payer: PRIVATE HEALTH INSURANCE | Attending: Emergency Medicine | Admitting: Emergency Medicine

## 2013-04-12 ENCOUNTER — Emergency Department (HOSPITAL_COMMUNITY): Payer: PRIVATE HEALTH INSURANCE

## 2013-04-12 ENCOUNTER — Encounter (HOSPITAL_COMMUNITY): Payer: Self-pay | Admitting: Emergency Medicine

## 2013-04-12 DIAGNOSIS — R1110 Vomiting, unspecified: Secondary | ICD-10-CM | POA: Insufficient documentation

## 2013-04-12 DIAGNOSIS — Z888 Allergy status to other drugs, medicaments and biological substances status: Secondary | ICD-10-CM | POA: Insufficient documentation

## 2013-04-12 DIAGNOSIS — R011 Cardiac murmur, unspecified: Secondary | ICD-10-CM | POA: Insufficient documentation

## 2013-04-12 DIAGNOSIS — IMO0002 Reserved for concepts with insufficient information to code with codable children: Secondary | ICD-10-CM | POA: Insufficient documentation

## 2013-04-12 DIAGNOSIS — M4120 Other idiopathic scoliosis, site unspecified: Secondary | ICD-10-CM | POA: Insufficient documentation

## 2013-04-12 DIAGNOSIS — I10 Essential (primary) hypertension: Secondary | ICD-10-CM | POA: Insufficient documentation

## 2013-04-12 DIAGNOSIS — F411 Generalized anxiety disorder: Secondary | ICD-10-CM | POA: Insufficient documentation

## 2013-04-12 DIAGNOSIS — R05 Cough: Secondary | ICD-10-CM

## 2013-04-12 DIAGNOSIS — J069 Acute upper respiratory infection, unspecified: Secondary | ICD-10-CM | POA: Insufficient documentation

## 2013-04-12 DIAGNOSIS — D649 Anemia, unspecified: Secondary | ICD-10-CM | POA: Insufficient documentation

## 2013-04-12 DIAGNOSIS — J3489 Other specified disorders of nose and nasal sinuses: Secondary | ICD-10-CM | POA: Insufficient documentation

## 2013-04-12 DIAGNOSIS — R51 Headache: Secondary | ICD-10-CM | POA: Insufficient documentation

## 2013-04-12 DIAGNOSIS — I6350 Cerebral infarction due to unspecified occlusion or stenosis of unspecified cerebral artery: Secondary | ICD-10-CM | POA: Insufficient documentation

## 2013-04-12 DIAGNOSIS — IMO0001 Reserved for inherently not codable concepts without codable children: Secondary | ICD-10-CM | POA: Insufficient documentation

## 2013-04-12 DIAGNOSIS — Z7982 Long term (current) use of aspirin: Secondary | ICD-10-CM | POA: Insufficient documentation

## 2013-04-12 DIAGNOSIS — K219 Gastro-esophageal reflux disease without esophagitis: Secondary | ICD-10-CM | POA: Insufficient documentation

## 2013-04-12 DIAGNOSIS — R059 Cough, unspecified: Secondary | ICD-10-CM

## 2013-04-12 HISTORY — DX: Essential (primary) hypertension: I10

## 2013-04-12 HISTORY — DX: Headache, unspecified: R51.9

## 2013-04-12 HISTORY — DX: Anemia, unspecified: D64.9

## 2013-04-12 HISTORY — DX: Headache: R51

## 2013-04-12 HISTORY — DX: Other chronic pain: G89.29

## 2013-04-12 LAB — COMPREHENSIVE METABOLIC PANEL
ALBUMIN: 3.4 g/dL — AB (ref 3.5–5.2)
ALK PHOS: 74 U/L (ref 39–117)
ALT: 10 U/L (ref 0–35)
AST: 14 U/L (ref 0–37)
BILIRUBIN TOTAL: 0.3 mg/dL (ref 0.3–1.2)
BUN: 12 mg/dL (ref 6–23)
CHLORIDE: 103 meq/L (ref 96–112)
CO2: 22 mEq/L (ref 19–32)
Calcium: 9.2 mg/dL (ref 8.4–10.5)
Creatinine, Ser: 0.74 mg/dL (ref 0.50–1.10)
GFR calc Af Amer: >90 mL/min (ref >90)
GFR calc non Af Amer: >90 mL/min (ref >90)
Glucose, Bld: 112 mg/dL — ABNORMAL HIGH (ref 70–99)
POTASSIUM: 4 meq/L (ref 3.7–5.3)
SODIUM: 141 meq/L (ref 137–147)
Total Protein: 7.3 g/dL (ref 6.0–8.3)

## 2013-04-12 LAB — CBC WITH DIFFERENTIAL/PLATELET
BASOS ABS: 0 10*3/uL (ref 0.0–0.1)
BASOS PCT: 0 % (ref 0–1)
EOS ABS: 0.2 10*3/uL (ref 0.0–0.7)
EOS PCT: 2 % (ref 0–5)
HCT: 35.9 % — ABNORMAL LOW (ref 36.0–46.0)
Hemoglobin: 11.8 g/dL — ABNORMAL LOW (ref 12.0–15.0)
LYMPHS PCT: 33 % (ref 12–46)
Lymphs Abs: 3.9 10*3/uL (ref 0.7–4.0)
MCH: 28.1 pg (ref 26.0–34.0)
MCHC: 32.9 g/dL (ref 30.0–36.0)
MCV: 85.5 fL (ref 78.0–100.0)
Monocytes Absolute: 0.7 10*3/uL (ref 0.1–1.0)
Monocytes Relative: 6 % (ref 3–12)
Neutro Abs: 6.8 10*3/uL (ref 1.7–7.7)
Neutrophils Relative %: 59 % (ref 43–77)
PLATELETS: 272 10*3/uL (ref 150–400)
RBC: 4.2 MIL/uL (ref 3.87–5.11)
RDW: 15 % (ref 11.5–15.5)
WBC: 11.6 10*3/uL — AB (ref 4.0–10.5)

## 2013-04-12 LAB — RAPID STREP SCREEN (MED CTR MEBANE ONLY): STREPTOCOCCUS, GROUP A SCREEN (DIRECT): NEGATIVE

## 2013-04-12 MED ORDER — BENZONATATE 100 MG PO CAPS: 100.0000 mg | ORAL_CAPSULE | Freq: Three times a day (TID) | ORAL | Status: DC | PRN

## 2013-04-12 MED ORDER — IPRATROPIUM BROMIDE 0.02 % IN SOLN
0.5000 mg | Freq: Once | RESPIRATORY_TRACT | Status: AC
  Administered 2013-04-12: 0.5 mg via RESPIRATORY_TRACT
  Filled 2013-04-12: qty 2.5

## 2013-04-12 MED ORDER — ALBUTEROL SULFATE HFA 108 (90 BASE) MCG/ACT IN AERS
2.0000 | INHALATION_SPRAY | RESPIRATORY_TRACT | Status: AC
  Administered 2013-04-12: 2 via RESPIRATORY_TRACT
  Filled 2013-04-12: qty 6.7

## 2013-04-12 MED ORDER — ALBUTEROL SULFATE (2.5 MG/3ML) 0.083% IN NEBU
5.0000 mg | INHALATION_SOLUTION | Freq: Once | RESPIRATORY_TRACT | Status: AC
  Administered 2013-04-12: 5 mg via RESPIRATORY_TRACT
  Filled 2013-04-12: qty 6

## 2013-04-12 NOTE — ED Notes (Signed)
Pt reports a cough and vomiting for the past 3 days. States she has had fever at home. Denies being around anyone sick. Skin warm and dry.

## 2013-04-12 NOTE — ED Notes (Signed)
Pt provided ginger ale for PO challenge  

## 2013-04-12 NOTE — Discharge Instructions (Signed)
°Emergency Department Resource Guide °1) Find a Doctor and Pay Out of Pocket °Although you won't have to find out who is covered by your insurance plan, it is a good idea to ask around and get recommendations. You will then need to call the office and see if the doctor you have chosen will accept you as a new patient and what types of options they offer for patients who are self-pay. Some doctors offer discounts or will set up payment plans for their patients who do not have insurance, but you will need to ask so you aren't surprised when you get to your appointment. ° °2) Contact Your Local Health Department °Not all health departments have doctors that can see patients for sick visits, but many do, so it is worth a call to see if yours does. If you don't know where your local health department is, you can check in your phone book. The CDC also has a tool to help you locate your state's health department, and many state websites also have listings of all of their local health departments. ° °3) Find a Walk-in Clinic °If your illness is not likely to be very severe or complicated, you may want to try a walk in clinic. These are popping up all over the country in pharmacies, drugstores, and shopping centers. They're usually staffed by nurse practitioners or physician assistants that have been trained to treat common illnesses and complaints. They're usually fairly quick and inexpensive. However, if you have serious medical issues or chronic medical problems, these are probably not your best option. ° °No Primary Care Doctor: °- Call [facility] at  832-8000 - they can help you locate a primary care doctor that  accepts your insurance, provides certain services, etc. °- Physician Referral Service- 1-800-533-3463 ° °Chronic Pain Problems: °Organization         Address  Phone   Notes  °[facility]  (336) [phone] Patients need to be referred by their primary care doctor.  ° °Medication  Assistance: °Organization         Address  Phone   Notes  °[place] [facility] [address] °[place], [place] [address] (336) 641-8030 --Must be a resident of [place] °-- Must have NO insurance coverage whatsoever (no Medicaid/ Medicare, etc.) °-- The pt. MUST have a primary care doctor that directs their care regularly and follows them in the community °  °[facility]  (866) 331-1348   °[employer]  (888) 892-1162   ° °Agencies that provide inexpensive medical care: °Organization         Address  Phone   Notes  °[facility]  (336) 832-8035   °[facility]    (336) 832-7272   °[facility] [address] °[place], [place] [address] (336) 832-4777   °[facility] [address], °[place] (336) 271-4999   °[facility]    (336) 373-0678   °[facility]    (336) 272-1050   °[facility] ° [address], [place] Phone:  (336) 832-4444, Fax:  (336) 832-4440 Hours of Operation:  9 am - 6 pm, M-F.  Also accepts Medicaid/Medicare and self-pay.  °[facility] ° [address], [place] Phone: (336) [phone], Fax: (336) [phone]. Hours of Operation:  8:30 am - 5:30 pm, M-F.  Also accepts Medicaid and self-pay.  °[facility] [place] [address], [place] Phone: (336) 878-6027   °[facility] [address], [place], [place] (336)723-1848, Ext. 123 Mondays & Thursdays: 7-9 AM.  First 15 patients are seen on a first come, first serve basis. °  ° °Medicaid-accepting [place] Providers: ° °Organization         Address  Phone   Notes  °[facility] [address], [place] (336) 641-2100 Also accepts self-pay patients.  °[facility] [address], [place] ° (336) 856-9996   °[facility] [address], [place]  (336) 288-8857   °[facility] [address] [place] [address], [place] (336) 299-7000   °Veita Bland [address], [place]  ° (336) 373-1557 Only accepts [place] Access Medicaid patients after they have their name applied to their card.  ° °Self-Pay (no insurance) in [place]: ° °Organization         Address  Phone   Notes  °Sickle Cell Patients, [facility] [address], [place] (336) 832-1970   °[facility] [address], [place] (336) 832-4400   °[facility] ° [address], [place] (336) 992-4800   °[facility]/Dr. Osei-Bonsu ° [address] [place] [address], [place] or [address], [place] (336) 841-8500 Phone number for both [place] and [place] locations is the same.  °[facility] [address], [place] (336) 299-0000   °[facility] [address] [place] [address], [place] or [address] (336) 852-7530 °(336) 878-2260   °[facility] [address], [place] (336) 350-1642, phone; (336) 294-5005, fax Sees patients 1st and 3rd Saturday of every month.  Must not qualify for public or private insurance (i.e. Medicaid, Medicare, [place] Health Choice, Veterans' Benefits) • Household income should be no more than 200% of the poverty level •The clinic cannot treat you if you are pregnant or think you are pregnant • Sexually transmitted diseases are not treated at the clinic.  ° ° °Dental Care: °Organization         Address  Phone  Notes  °[place] [facility] [address], [place] (336) 641-6152 Accepts children up to age 21 who are enrolled in Medicaid or [place] Health Choice; pregnant women with a Medicaid card; and children who have applied for Medicaid or [place] Health Choice, but were declined, whose parents can pay a reduced fee at time of service.  °[place]  [facility] [place]  [address], [place] (336) 641-7733 Accepts children up to age 21 who are enrolled in Medicaid or [place] Health Choice; pregnant women with a Medicaid card; and children who have applied for Medicaid or [place] Health Choice, but were declined, whose parents can pay a reduced fee at time of service.  °[facility] ° [address], [place] (336) 641-4533 Patients are seen by appointment only. Walk-ins are not accepted. [facility] will see patients 18 years of age and older. °Monday - Tuesday (8am-5pm) °Most Wednesdays (8:30-5pm) °$30 per visit, cash only  °[facility] ° [address], [place] (336) 641-4533 Patients are seen by appointment only. Walk-ins are not accepted. [facility] will see patients 18 years of age and older. °One   Wednesday Evening (Monthly: Volunteer Based).  $30 per visit, cash only  °[facility]  (919) [phone] for adults; Children under age 4, call Graduate Pediatric Dentistry at ([phone]) [phone]. Children aged 4-14, please call ([phone]) [phone] to request a pediatric application. ° Dental services are provided in all areas of dental care including fillings, crowns and bridges, complete and partial dentures, implants, gum treatment, root canals, and extractions. Preventive care is also provided. Treatment is provided to both adults and children. °Patients are selected via a lottery and there is often a waiting list. °  °[facility] [address], °[place] ° (336) 763-8833 [email or web address] °  °[facility] [address], [place], [place] (336)723-1848, Ext. 123 Second and Fourth Thursday of each month, opens at 6:30 AM; Clinic ends at 9 AM.  Patients are seen on a first-come first-served basis, and a limited number are seen during each clinic.  ° °[facility] ° [address], [place], [place] (336) 723-7904    Eligibility Requirements °You must have lived in [place], [place], or [place] counties for at least the last three months. °  You cannot be eligible for state or federal sponsored healthcare insurance, including Veterans Administration, Medicaid, or Medicare. °  You generally cannot be eligible for healthcare insurance through your employer.  °  How to apply: °Eligibility screenings are held every Tuesday and Wednesday afternoon from 1:00 pm until 4:00 pm. You do not need an appointment for the interview!  °[facility] [address], [place], [place] 336-631-2330   °[facility]  336-342-8273   °[facility]  336-703-3100   °[facility]  336-570-6415   ° °Behavioral Health Resources in the Community: °Intensive Outpatient Programs °Organization         Address  Phone  Notes  °[facility] [address], [place], [place] 336-878-6098   °[facility] [address], [place], [place] 336-832-9800   °[facility] [address], [place], [place] ° 336-882-2125   °[facility] [address],  °[place], [place] 1-800-853-5163 or 336-641-4981   °Substance Abuse Resources °Organization         Address  Phone  Notes  °[facility]  336-882-2125   °[facility]  336-784-9470   °[facility]  336-285-9073   °[facility]  336-845-3988   °Residential & Outpatient Substance Abuse Program  1-800-659-3381   °Psychological Services °Organization         Address  Phone  Notes  °[facility]  336- 832-9600   °[facility]  336- 378-7881   °[facility] [address], [place] 1-800-853-5163 or 336-641-4981   ° °Mobile Crisis Teams °Organization         Address  Phone  Notes  °[facility], Mobile Crisis Care Unit  1-877-626-1772   °Assertive °[facility] ° [address]  [place], [place] 336-834-9664   °Sharon DeEsch [address] °[place] 336-554-5454   ° °Self-Help/Support Groups °Organization         Address  Phone             Notes  °[facility]  - variety of support groups  336- 373-1402 Call for more information  °Narcotics Anonymous (NA), [facility] [address], °[place]  2 meetings at this location  ° °  Residential Treatment Programs Organization         Address  Phone  Notes  [facility] [address],    [place]  [phone]   [facility]  [address], [place] [phone], [place], [place] [phone]   [facility] [address], [place] [phone] Admissions: 8am-3pm M-F  [facility] [address]    [place], [place] [phone]   [facility] [address], [place], [place] [phone]   [facility] [address]  [place], [place] [phone]   [facility] Intensive Outpatient [address], [place], [place] [phone]   [facility] [address]  [place], [place] [phone] or [phone])[phone]   [facility] ([facility] [address] [place], [place] [phone] Accepts Medicaid  [facility] [address]  [place] [phone] Substance Abuse/Addiction Treatment   [facility]         Address  Phone  Notes  [facility]  [phone](888) [phone]   Angie FavaJulie Brannon, PhD [address], [address] [place], [place]   [phone](336) [phone] or [phone](336) ([phone]   [facility]   [address] [place], [place] [phone](336) [phone]   [facility] [address], [place], [place] [phone](336) [phone] Insurance/Medicaid/sponsorship through [facility]  [facility] [address]                                    [place], [place] [phone](336) [phone] Therapy/tele-psych/case    [facility] [address]   [place], [place] [phone](336) [phone]    Dr. Lolly MustacheArfeen  [phone]) [phone]   [facility]  [employer] [facility] 1) [address], [place] 2) [address], [place] 3)  [address], [place] [phone](336) [phone](336) [phone]  [phone](336) [phone]   [facility] Child Abuse Hotline [phone](336) [phone] or [phone](336) [phone] (After Hours)       Take the prescription as directed.  Use your albuterol inhaler (2 to 4 puffs) every 4 hours for the next 7 days, then as needed for cough, wheezing, or shortness of breath. Take over the counter decongestant (such as sudafed), as directed on packaging, for the next week.  Use over the counter normal saline nasal spray, as instructed in the Emergency Department, several times per day for the next 2 weeks.  Take over the counter tylenol and ibuprofen, as directed on packaging, as needed for discomfort.  Gargle with warm water several times per day to help with discomfort.  May also use over the counter sore throat pain medicines such as chloraseptic or sucrets, as directed on packaging, as needed for discomfort.  Call your regular medical doctor tomorrow to schedule a follow up appointment within the next 2 days.  Return to the Emergency Department immediately if worsening.

## 2013-04-12 NOTE — ED Provider Notes (Signed)
CSN: [id number]     Arrival date & time 04/12/13  1254 History   First MD Initiated Contact with Patient 04/12/13 1508     Chief Complaint  Patient presents with  . Cough  . Emesis    HPI Pt was seen at 1510.  Per pt, c/o gradual onset and persistence of constant sore throat, runny/stuffy nose, sinus congestion, "chills," and cough for the past 3 days.  Cough has been associated with post-tussive emesis.  Denies fevers, no rash, no CP/SOB, no N/V without coughing, no diarrhea, no abd pain, no back pain, no black or blood in stools or emesis.    Past Medical History  Diagnosis Date  . Fibromyalgia   . Acid reflux   . Scoliosis   . Heart murmur   . Anxiety   . Chronic headaches   . Anemia   . Stroke 1997  . Hypertension   . Degenerative disc disease     lumbar   Past Surgical History  Procedure Laterality Date  . Abdominal hysterectomy    . Cesarean section    . Laparoscopic unilateral salpingo oopherectomy Right 2005    History  Substance Use Topics  . Smoking status: Never Smoker   . Smokeless tobacco: Not on file  . Alcohol Use: No    Review of Systems ROS: Statement: All systems negative except as marked or noted in the HPI; Constitutional: Negative for fever and +chills. ; ; Eyes: Negative for eye pain, redness and discharge. ; ; ENMT: Negative for ear pain, hoarseness, +nasal congestion, sinus pressure and sore throat. ; ; Cardiovascular: Negative for chest pain, palpitations, diaphoresis, dyspnea and peripheral edema. ; ; Respiratory: +cough, post-tussive emesis. Negative for wheezing and stridor. ; ; Gastrointestinal: Negative for nausea, vomiting, diarrhea, abdominal pain, blood in stool, hematemesis, jaundice and rectal bleeding. . ; ; Genitourinary: Negative for dysuria, flank pain and hematuria. ; ; Musculoskeletal: Negative for back pain and neck pain. Negative for swelling and trauma.; ; Skin: Negative for pruritus, rash, abrasions, blisters, bruising and skin  lesion.; ; Neuro: Negative for headache, lightheadedness and neck stiffness. Negative for weakness, altered level of consciousness , altered mental status, extremity weakness, paresthesias, involuntary movement, seizure and syncope.      Allergies  Milnacipran; Pregabalin; Topiramate; and Metronidazole  Home Medications   Current Outpatient Rx  Name  Route  Sig  Dispense  Refill  . aspirin 81 MG chewable tablet   Oral   Chew 81 mg by mouth daily.         Marland Kitchen etodolac (LODINE) 500 MG tablet   Oral   Take 500 mg by mouth 2 (two) times daily as needed (pain).          Marland Kitchen ibuprofen (ADVIL,MOTRIN) 200 MG tablet   Oral   Take 200-400 mg by mouth every 6 (six) hours as needed for fever, headache, mild pain or moderate pain.          BP 119/80  Pulse 99  Temp(Src) 98.7 F (37.1 C) (Oral)  Resp 18  Ht 5\' 4"  (1.626 m)  Wt 253 lb (114.76 kg)  BMI 43.41 kg/m2  SpO2 100% Physical Exam 1515: Physical examination:  Nursing notes reviewed; Vital signs and O2 SAT reviewed;  Constitutional: Well developed, Well nourished, Well hydrated, In no acute distress; Head:  Normocephalic, atraumatic; Eyes: EOMI, PERRL, No scleral icterus; ENMT: TM's clear bilat. +edemetous nasal turbinates bilat with clear rhinorrhea. Mouth and pharynx without lesions. No tonsillar exudates. No intra-oral  edema. No submandibular or sublingual edema. No hoarse voice, no drooling, no stridor. No pain with manipulation of larynx. Mouth and pharynx normal, Mucous membranes moist; Neck: Supple, Full range of motion, No lymphadenopathy; Cardiovascular: Regular rate and rhythm, No gallop; Respiratory: Breath sounds coarse & equal bilaterally, No wheezes. Speaking full sentences with ease, Normal respiratory effort/excursion; Chest: Nontender, Movement normal; Abdomen: Soft, Nontender, Nondistended, Normal bowel sounds; Genitourinary: No CVA tenderness; Extremities: Pulses normal, No tenderness, No edema, No calf edema or  asymmetry.; Neuro: AA&Ox3, Major CN grossly intact.  Speech clear. No gross focal motor or sensory deficits in extremities.; Skin: Color normal, Warm, Dry.   ED Course  Procedures   EKG Interpretation   None       MDM  MDM Reviewed: previous chart, nursing note and vitals Reviewed previous: labs Interpretation: labs and x-ray     Results for orders placed during the hospital encounter of 04/12/13  RAPID STREP SCREEN      Result Value Range   Streptococcus, Group A Screen (Direct) NEGATIVE  NEGATIVE  COMPREHENSIVE METABOLIC PANEL      Result Value Range   Sodium 141  137 - 147 mEq/L   Potassium 4.0  3.7 - 5.3 mEq/L   Chloride 103  96 - 112 mEq/L   CO2 22  19 - 32 mEq/L   Glucose, Bld 112 (*) 70 - 99 mg/dL   BUN 12  6 - 23 mg/dL   Creatinine, Ser 4.090.74  0.50 - 1.10 mg/dL   Calcium 9.2  8.4 - 81.110.5 mg/dL   Total Protein 7.3  6.0 - 8.3 g/dL   Albumin 3.4 (*) 3.5 - 5.2 g/dL   AST 14  0 - 37 U/L   ALT 10  0 - 35 U/L   Alkaline Phosphatase 74  39 - 117 U/L   Total Bilirubin 0.3  0.3 - 1.2 mg/dL   GFR calc non Af Amer >90  >90 mL/min   GFR calc Af Amer >90  >90 mL/min  CBC WITH DIFFERENTIAL      Result Value Range   WBC 11.6 (*) 4.0 - 10.5 K/uL   RBC 4.20  3.87 - 5.11 MIL/uL   Hemoglobin 11.8 (*) 12.0 - 15.0 g/dL   HCT 91.435.9 (*) 78.236.0 - 95.646.0 %   MCV 85.5  78.0 - 100.0 fL   MCH 28.1  26.0 - 34.0 pg   MCHC 32.9  30.0 - 36.0 g/dL   RDW 21.315.0  08.611.5 - 57.815.5 %   Platelets 272  150 - 400 K/uL   Neutrophils Relative % 59  43 - 77 %   Neutro Abs 6.8  1.7 - 7.7 K/uL   Lymphocytes Relative 33  12 - 46 %   Lymphs Abs 3.9  0.7 - 4.0 K/uL   Monocytes Relative 6  3 - 12 %   Monocytes Absolute 0.7  0.1 - 1.0 K/uL   Eosinophils Relative 2  0 - 5 %   Eosinophils Absolute 0.2  0.0 - 0.7 K/uL   Basophils Relative 0  0 - 1 %   Basophils Absolute 0.0  0.0 - 0.1 K/uL   Dg Chest 2 View 04/12/2013   CLINICAL DATA:  Productive cough.  Chest burning.  Emesis.  EXAM: CHEST  2 VIEW  COMPARISON:   Chest x-ray 05/12/2011.  FINDINGS: Lung volumes are normal. No consolidative airspace disease. No pleural effusions. No pneumothorax. No pulmonary nodule or mass noted. Pulmonary vasculature and the cardiomediastinal silhouette are within  normal limits.  IMPRESSION: 1.  No radiographic evidence of acute cardiopulmonary disease.   Electronically Signed   By: Trudie Reed M.D.   On: 04/12/2013 14:57    1725:  Feels better after neb and wants to go home now. Has tol PO well while in the ED without N/V. Sats remain 99-100% R/A, lungs CTA bilat, resps easy, NAD. Dx and testing d/w pt.  Questions answered.  Verb understanding, agreeable to d/c home with outpt f/u.   Laray Anger, DO 04/14/13 1445

## 2013-04-14 LAB — CULTURE, GROUP A STREP

## 2013-06-16 ENCOUNTER — Other Ambulatory Visit (HOSPITAL_COMMUNITY): Payer: Self-pay | Admitting: Nurse Practitioner

## 2013-06-16 DIAGNOSIS — Z1231 Encounter for screening mammogram for malignant neoplasm of breast: Secondary | ICD-10-CM

## 2013-06-26 ENCOUNTER — Ambulatory Visit (HOSPITAL_COMMUNITY)
Admission: RE | Admit: 2013-06-26 | Discharge: 2013-06-26 | Disposition: A | Discharge status: Home / Self Care | Payer: PRIVATE HEALTH INSURANCE | Source: Ambulatory Visit | Attending: Nurse Practitioner | Admitting: Nurse Practitioner

## 2013-06-26 DIAGNOSIS — Z1231 Encounter for screening mammogram for malignant neoplasm of breast: Secondary | ICD-10-CM | POA: Insufficient documentation

## 2013-12-29 ENCOUNTER — Ambulatory Visit: Payer: PRIVATE HEALTH INSURANCE

## 2014-01-05 ENCOUNTER — Ambulatory Visit: Payer: PRIVATE HEALTH INSURANCE

## 2014-01-12 ENCOUNTER — Ambulatory Visit: Payer: PRIVATE HEALTH INSURANCE

## 2014-07-19 ENCOUNTER — Other Ambulatory Visit: Payer: Self-pay

## 2014-07-19 DIAGNOSIS — Z1231 Encounter for screening mammogram for malignant neoplasm of breast: Secondary | ICD-10-CM

## 2014-08-03 ENCOUNTER — Ambulatory Visit
Admission: RE | Admit: 2014-08-03 | Discharge: 2014-08-03 | Disposition: A | Discharge status: Home / Self Care | Payer: Medicare Other | Source: Ambulatory Visit

## 2014-08-03 DIAGNOSIS — Z1231 Encounter for screening mammogram for malignant neoplasm of breast: Secondary | ICD-10-CM

## 2014-08-04 ENCOUNTER — Encounter (HOSPITAL_COMMUNITY): Payer: Self-pay | Admitting: Emergency Medicine

## 2014-08-04 ENCOUNTER — Ambulatory Visit (HOSPITAL_COMMUNITY): Payer: Medicare Other

## 2014-08-04 ENCOUNTER — Observation Stay (HOSPITAL_COMMUNITY)
Admission: EM | Admit: 2014-08-04 | Discharge: 2014-08-05 | Disposition: A | Discharge status: Home / Self Care | Payer: Medicare Other | Attending: Internal Medicine | Admitting: Internal Medicine

## 2014-08-04 DIAGNOSIS — Z6841 Body Mass Index (BMI) 40.0 and over, adult: Secondary | ICD-10-CM | POA: Insufficient documentation

## 2014-08-04 DIAGNOSIS — R208 Other disturbances of skin sensation: Secondary | ICD-10-CM | POA: Diagnosis not present

## 2014-08-04 DIAGNOSIS — M419 Scoliosis, unspecified: Secondary | ICD-10-CM | POA: Insufficient documentation

## 2014-08-04 DIAGNOSIS — R51 Headache: Secondary | ICD-10-CM | POA: Insufficient documentation

## 2014-08-04 DIAGNOSIS — I6523 Occlusion and stenosis of bilateral carotid arteries: Secondary | ICD-10-CM | POA: Insufficient documentation

## 2014-08-04 DIAGNOSIS — Z8739 Personal history of other diseases of the musculoskeletal system and connective tissue: Secondary | ICD-10-CM

## 2014-08-04 DIAGNOSIS — M5136 Other intervertebral disc degeneration, lumbar region: Secondary | ICD-10-CM | POA: Insufficient documentation

## 2014-08-04 DIAGNOSIS — F419 Anxiety disorder, unspecified: Secondary | ICD-10-CM | POA: Diagnosis not present

## 2014-08-04 DIAGNOSIS — K219 Gastro-esophageal reflux disease without esophagitis: Secondary | ICD-10-CM | POA: Diagnosis not present

## 2014-08-04 DIAGNOSIS — E785 Hyperlipidemia, unspecified: Secondary | ICD-10-CM | POA: Diagnosis not present

## 2014-08-04 DIAGNOSIS — I69354 Hemiplegia and hemiparesis following cerebral infarction affecting left non-dominant side: Secondary | ICD-10-CM | POA: Insufficient documentation

## 2014-08-04 DIAGNOSIS — G4730 Sleep apnea, unspecified: Secondary | ICD-10-CM | POA: Diagnosis present

## 2014-08-04 DIAGNOSIS — Z87440 Personal history of urinary (tract) infections: Secondary | ICD-10-CM | POA: Diagnosis not present

## 2014-08-04 DIAGNOSIS — E119 Type 2 diabetes mellitus without complications: Secondary | ICD-10-CM | POA: Insufficient documentation

## 2014-08-04 DIAGNOSIS — R200 Anesthesia of skin: Secondary | ICD-10-CM | POA: Diagnosis not present

## 2014-08-04 DIAGNOSIS — M797 Fibromyalgia: Secondary | ICD-10-CM | POA: Diagnosis not present

## 2014-08-04 DIAGNOSIS — G8929 Other chronic pain: Secondary | ICD-10-CM | POA: Insufficient documentation

## 2014-08-04 DIAGNOSIS — Z888 Allergy status to other drugs, medicaments and biological substances status: Secondary | ICD-10-CM | POA: Insufficient documentation

## 2014-08-04 DIAGNOSIS — I10 Essential (primary) hypertension: Secondary | ICD-10-CM | POA: Insufficient documentation

## 2014-08-04 DIAGNOSIS — Z90710 Acquired absence of both cervix and uterus: Secondary | ICD-10-CM | POA: Diagnosis not present

## 2014-08-04 DIAGNOSIS — G459 Transient cerebral ischemic attack, unspecified: Secondary | ICD-10-CM

## 2014-08-04 DIAGNOSIS — R531 Weakness: Secondary | ICD-10-CM | POA: Diagnosis not present

## 2014-08-04 LAB — I-STAT CHEM 8, ED
BUN: 12 mg/dL (ref 6–20)
Calcium, Ion: 1.3 mmol/L — ABNORMAL HIGH (ref 1.12–1.23)
Chloride: 105 mmol/L (ref 101–111)
Creatinine, Ser: 0.8 mg/dL (ref 0.44–1.00)
Glucose, Bld: 106 mg/dL — ABNORMAL HIGH (ref 70–99)
HCT: 37 % (ref 36.0–46.0)
HEMOGLOBIN: 12.6 g/dL (ref 12.0–15.0)
POTASSIUM: 4 mmol/L (ref 3.5–5.1)
Sodium: 143 mmol/L (ref 135–145)
TCO2: 25 mmol/L (ref 0–100)

## 2014-08-04 LAB — RAPID URINE DRUG SCREEN, HOSP PERFORMED
AMPHETAMINES: NOT DETECTED
BARBITURATES: NOT DETECTED
BENZODIAZEPINES: NOT DETECTED
Cocaine: NOT DETECTED
Opiates: NOT DETECTED
Tetrahydrocannabinol: NOT DETECTED

## 2014-08-04 LAB — URINALYSIS, ROUTINE W REFLEX MICROSCOPIC
Bilirubin Urine: NEGATIVE
Glucose, UA: 100 mg/dL — AB
HGB URINE DIPSTICK: NEGATIVE
Ketones, ur: NEGATIVE mg/dL
Leukocytes, UA: NEGATIVE
Nitrite: NEGATIVE
PROTEIN: NEGATIVE mg/dL
Specific Gravity, Urine: 1.026 (ref 1.005–1.030)
UROBILINOGEN UA: 0.2 mg/dL (ref 0.0–1.0)
pH: 6.5 (ref 5.0–8.0)

## 2014-08-04 LAB — CBC
HEMATOCRIT: 36.1 % (ref 36.0–46.0)
Hemoglobin: 11.2 g/dL — ABNORMAL LOW (ref 12.0–15.0)
MCH: 26.6 pg (ref 26.0–34.0)
MCHC: 31 g/dL (ref 30.0–36.0)
MCV: 85.7 fL (ref 78.0–100.0)
Platelets: 293 10*3/uL (ref 150–400)
RBC: 4.21 MIL/uL (ref 3.87–5.11)
RDW: 15.1 % (ref 11.5–15.5)
WBC: 10.8 10*3/uL — AB (ref 4.0–10.5)

## 2014-08-04 LAB — PROTIME-INR
INR: 0.97 (ref 0.00–1.49)
Prothrombin Time: 12.9 seconds (ref 11.6–15.2)

## 2014-08-04 LAB — COMPREHENSIVE METABOLIC PANEL
ALBUMIN: 3.5 g/dL (ref 3.5–5.0)
ALT: 14 U/L (ref 14–54)
AST: 12 U/L — ABNORMAL LOW (ref 15–41)
Alkaline Phosphatase: 69 U/L (ref 38–126)
Anion gap: 8 (ref 5–15)
BUN: 13 mg/dL (ref 6–20)
CALCIUM: 9.6 mg/dL (ref 8.9–10.3)
CHLORIDE: 106 mmol/L (ref 101–111)
CO2: 28 mmol/L (ref 22–32)
CREATININE: 0.82 mg/dL (ref 0.44–1.00)
GFR calc Af Amer: >60 mL/min (ref >60)
GFR calc non Af Amer: >60 mL/min (ref >60)
Glucose, Bld: 110 mg/dL — ABNORMAL HIGH (ref 70–99)
Potassium: 4.3 mmol/L (ref 3.5–5.1)
Sodium: 142 mmol/L (ref 135–145)
Total Bilirubin: 0.4 mg/dL (ref 0.3–1.2)
Total Protein: 7.2 g/dL (ref 6.5–8.1)

## 2014-08-04 LAB — GLUCOSE, CAPILLARY: GLUCOSE-CAPILLARY: 110 mg/dL — AB (ref 70–99)

## 2014-08-04 LAB — I-STAT TROPONIN, ED
Troponin i, poc: 0 ng/mL (ref 0.00–0.08)
Troponin i, poc: 0 ng/mL (ref 0.00–0.08)

## 2014-08-04 LAB — DIFFERENTIAL
BASOS ABS: 0 10*3/uL (ref 0.0–0.1)
Basophils Relative: 0 % (ref 0–1)
EOS ABS: 0.1 10*3/uL (ref 0.0–0.7)
Eosinophils Relative: 1 % (ref 0–5)
LYMPHS ABS: 4.1 10*3/uL — AB (ref 0.7–4.0)
Lymphocytes Relative: 38 % (ref 12–46)
MONOS PCT: 5 % (ref 3–12)
Monocytes Absolute: 0.5 10*3/uL (ref 0.1–1.0)
Neutro Abs: 6 10*3/uL (ref 1.7–7.7)
Neutrophils Relative %: 56 % (ref 43–77)

## 2014-08-04 LAB — APTT: aPTT: 32 seconds (ref 24–37)

## 2014-08-04 LAB — ETHANOL: Alcohol, Ethyl (B): <5 mg/dL (ref <5)

## 2014-08-04 MED ORDER — ASPIRIN 81 MG PO CHEW: 324.0000 mg | CHEWABLE_TABLET | Freq: Once | ORAL | Status: DC

## 2014-08-04 MED ORDER — STROKE: EARLY STAGES OF RECOVERY BOOK
Freq: Once | Status: AC
Start: 2014-08-04 — End: 2014-08-04
  Administered 2014-08-04: 20:00:00

## 2014-08-04 MED ORDER — ASPIRIN 325 MG PO TABS
325.0000 mg | ORAL_TABLET | Freq: Every day | ORAL | Status: DC
  Administered 2014-08-04 – 2014-08-05 (×2): 325 mg via ORAL
  Filled 2014-08-04 (×2): qty 1

## 2014-08-04 MED ORDER — SODIUM CHLORIDE 0.9 % IV BOLUS (SEPSIS)
1000.0000 mL | Freq: Once | INTRAVENOUS | Status: AC
  Administered 2014-08-04: 1000 mL via INTRAVENOUS

## 2014-08-04 MED ORDER — ASPIRIN 300 MG RE SUPP: 300.0000 mg | Freq: Every day | RECTAL | Status: DC

## 2014-08-04 NOTE — H&P (Signed)
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  No headache.  No numbness or tingling.  Just weaker than normal.  No recent fevers.  No n/v/d.  No recent illnesses.  No facial drooping, drooling and numb/tingling in face.  Stopped taking her aspirin over 2 weeks ago, because she ran out at home.  Review of Systems:  Positive and negative as per HPI otherwise all other systems are negative  Past Medical History: Past Medical History  Diagnosis Date  . Fibromyalgia   . Acid reflux   . Scoliosis   . Heart murmur   . Anxiety   . Chronic headaches   . Anemia   . Stroke 1997  . Hypertension   . Degenerative disc disease     lumbar   Past Surgical History  Procedure Laterality Date  . Abdominal hysterectomy    . Cesarean section    . Laparoscopic unilateral salpingo oopherectomy Right 2005    Medications: Prior to Admission medications   Medication Sig Start Date End Date Taking? Authorizing Provider  albuterol (PROVENTIL HFA;VENTOLIN HFA) 108 (90 BASE) MCG/ACT inhaler Inhale 1-2 puffs into the lungs every 6 (six) hours as needed for wheezing or shortness of breath.   Yes Historical Provider, MD  Ascorbic Acid (VITAMIN C) 1000 MG tablet Take 1,000 mg by mouth daily.   Yes Historical Provider, MD  cyclobenzaprine (FLEXERIL) 10 MG tablet Take 10 mg by mouth 3 (three) times daily as needed for muscle spasms.   Yes Historical Provider, MD  Fish Oil-Cholecalciferol (FISH OIL + D3) 1200-1000 MG-UNIT CAPS Take 1 capsule by mouth daily.    Yes Historical Provider, MD  furosemide (LASIX) 20 MG tablet Take 20 mg by mouth every other day.   Yes Historical Provider, MD  meloxicam (MOBIC) 7.5 MG tablet Take 7.5 mg by mouth daily.   Yes Historical Provider, MD  metFORMIN (GLUCOPHAGE) 500 MG tablet Take 500 mg by mouth  daily.   Yes Historical Provider, MD  naproxen (NAPROSYN) 500 MG tablet Take 500 mg by mouth every 12 (twelve) hours as needed for moderate pain.   Yes Historical Provider, MD  pantoprazole (PROTONIX) 40 MG tablet Take 40 mg by mouth 2 (two) times daily.   Yes Historical Provider, MD  benzonatate (TESSALON) 100 MG capsule Take 1 capsule (100 mg total) by mouth 3 (three) times daily as needed for cough. Patient not taking: Reported on 08/04/2014 04/12/13   Samuel JesterKathleen McManus, DO  ibuprofen (ADVIL,MOTRIN) 200 MG tablet Take 200-400 mg by mouth every 6 (six) hours as needed for fever, headache, mild pain or moderate pain.    Historical Provider, MD    Allergies:   Allergies  Allergen Reactions  . Milnacipran     REACTION: palpitations  . Pregabalin Other (See Comments)    unknown  . Topiramate     REACTION: caused "inside seizures"  . Metronidazole Rash    Social History:  reports that she has never smoked. She does not have any smokeless tobacco history on file. She reports that she does not drink alcohol or use illicit drugs.  Family History: Reviewed,   Physical Exam: Filed Vitals:   08/04/14 1400 08/04/14 1430 08/04/14 1537 08/04/14 1606  BP: 143/85 144/84 131/79 127/75  Pulse: 70 74 82 68  Temp:   98.1 F (36.7 C)   TempSrc:   Oral   Resp: 19 18 20 18   SpO2: 100% 100% 99% 98%   General appearance: alert, cooperative and no distress Head: Normocephalic, without obvious abnormality, atraumatic Eyes: negative Nose: Nares normal. Septum midline. Mucosa normal. No drainage or sinus tenderness. Neck: no JVD and supple, symmetrical, trachea midline Lungs: clear to auscultation bilaterally Heart: regular rate and rhythm, S1, S2 normal, no murmur, click, rub or gallop Abdomen: soft, non-tender; bowel sounds normal; no masses,  no organomegaly Extremities: extremities normal, atraumatic, no cyanosis or edema Pulses: 2+ and symmetric Skin: Skin color, texture, turgor normal. No  rashes or lesions Neurologic: Mental status: Alert, oriented, thought content appropriate Cranial nerves: normal Motor: grossly normal   Labs on Admission:   Recent Labs  08/04/14 1325 08/04/14 1332  NA 142 143  K 4.3 4.0  CL 106 105  CO2 28  --   GLUCOSE 110* 106*  BUN 13 12  CREATININE 0.82 0.80  CALCIUM 9.6  --     Recent Labs  08/04/14 1325  AST 12*  ALT 14  ALKPHOS 69  BILITOT 0.4  PROT 7.2  ALBUMIN 3.5    Recent Labs  08/04/14 1325 08/04/14 1332  WBC 10.8*  --   NEUTROABS 6.0  --   HGB 11.2* 12.6  HCT 36.1 37.0  MCV 85.7  --   PLT 293  --     Radiological Exams on Admission: Ct Head Wo Contrast  08/04/2014   CLINICAL DATA:  Acute onset left-sided weakness earlier today  EXAM: CT HEAD WITHOUT CONTRAST  TECHNIQUE: Contiguous axial images were obtained from the base of the skull through the vertex without intravenous contrast.  COMPARISON:  January 15, 2011  FINDINGS: The ventricles are normal in size and configuration. There is no intracranial mass, hemorrhage, extra-axial fluid collection, or midline shift. Gray-white compartments are normal. No acute infarct apparent. The bony calvarium appears intact. The mastoid air cells are clear.  IMPRESSION: Study within normal limits. No intracranial mass, hemorrhage, or focal gray - white compartment lesions/acute appearing infarct.   Electronically Signed   By: Bretta BangWilliam  Woodruff III M.D.   On: 08/04/2014 13:49   Mm Screening Breast Tomo Bilateral  08/03/2014   CLINICAL DATA:  Screening.  EXAM: DIGITAL SCREENING BILATERAL MAMMOGRAM WITH 3D TOMO WITH CAD  COMPARISON:  Previous exam(s).  ACR Breast Density Category b: There are scattered areas of fibroglandular density.  FINDINGS: There are no findings suspicious for malignancy. Waxing and waning oval and round masses are again noted bilaterally, most compatible with cysts or other benign findings. Images were processed with CAD.  IMPRESSION: No mammographic evidence of  malignancy. A result letter of this screening mammogram will be mailed directly to the patient.  RECOMMENDATION: Screening mammogram in one year. (Code:SM-B-01Y)  BI-RADS CATEGORY  1: Negative.   Electronically Signed   By: Annia Beltrew  Davis M.D.   On: 08/03/2014 13:29    Assessment/Plan  49 yo female concerns for TIA/CVA with left sided weakness  Principal Problem:   Left sided weakness-  tia w/u.  Neurology following and requesting transfer to [place].  Aspirin.  Not tpa candidate.  freq neuro checks.  Active Problems:  Stable unless otherwise noted   Morbid obesity   GERD   Sleep apnea   H/O fibromyalgia  obs on tele at [place].  Full code.  Clarify home meds per pharm.  Treysen Sudbeck A 08/04/2014, 4:49 PM

## 2014-08-04 NOTE — Consult Note (Signed)
Admission H&P    Chief Complaint: New onset left-sided numbness.  HPI: Helen [name] is an 49 y.o. female with a history of hypertension, diabetes mellitus, stroke in 1997, fibromyalgia and degenerative disc disease, presenting with new onset numbness involving left upper and lower extremities. Patient has residual numbness involving left side of her face from previous stroke. She also noted that her left leg felt slightly weak today. She was last seen well about 9 PM on 08/03/2014. She has not been on antiplatelet therapy. CT scan of her head showed no acute intracranial abnormality. NIH stroke score was 1 for left-sided numbness.  LSN: 9 PM on 08/03/2014 tPA Given: No: Minimal deficits and beyond time under for treatment consideration mRankin:  Past Medical History  Diagnosis Date  . Fibromyalgia   . Acid reflux   . Scoliosis   . Heart murmur   . Anxiety   . Chronic headaches   . Anemia   . Stroke 1997  . Hypertension   . Degenerative disc disease     lumbar    Past Surgical History  Procedure Laterality Date  . Abdominal hysterectomy    . Cesarean section    . Laparoscopic unilateral salpingo oopherectomy Right 2005   Family history: Positive for myocardial infarction involving her father, as well as hypertension and diabetes mellitus involving multiple family members on both sides.  Social History:  reports that she has never smoked. She does not have any smokeless tobacco history on file. She reports that she does not drink alcohol or use illicit drugs.  Allergies:  Allergies  Allergen Reactions  . Milnacipran     REACTION: palpitations  . Pregabalin Other (See Comments)    unknown  . Topiramate     REACTION: caused "inside seizures"  . Metronidazole Rash   Medications: Patient's preadmission medications were reviewed by me.  ROS: History obtained from the patient  General ROS: negative for - chills, fatigue, fever, night sweats, weight gain or weight  loss Psychological ROS: negative for - behavioral disorder, hallucinations, memory difficulties, mood swings or suicidal ideation Ophthalmic ROS: negative for - blurry vision, double vision, eye pain or loss of vision ENT ROS: negative for - epistaxis, nasal discharge, oral lesions, sore throat, tinnitus or vertigo Allergy and Immunology ROS: negative for - hives or itchy/watery eyes Hematological and Lymphatic ROS: negative for - bleeding problems, bruising or swollen lymph nodes Endocrine ROS: negative for - galactorrhea, hair pattern changes, polydipsia/polyuria or temperature intolerance Respiratory ROS: negative for - cough, hemoptysis, shortness of breath or wheezing Cardiovascular ROS: negative for - chest pain, dyspnea on exertion, edema or irregular heartbeat Gastrointestinal ROS: negative for - abdominal pain, diarrhea, hematemesis, nausea/vomiting or stool incontinence Genito-Urinary ROS: negative for - dysuria, hematuria, incontinence or urinary frequency/urgency Musculoskeletal ROS: negative for - joint swelling or muscular weakness Neurological ROS: as noted in HPI Dermatological ROS: negative for rash and skin lesion changes  Physical Examination: Blood pressure 127/75, pulse 68, temperature 98.1 F (36.7 C), temperature source Oral, resp. rate 18, SpO2 98 %.  HEENT-  Normocephalic, no lesions, without obvious abnormality.  Normal external eye and conjunctiva.  Normal TM's bilaterally.  Normal auditory canals and external ears. Normal external nose, mucus membranes and septum.  Normal pharynx. Neck supple with no masses, nodes, nodules or enlargement. Cardiovascular - regular rate and rhythm, S1, S2 normal, no murmur, click, rub or gallop Lungs - chest clear, no wheezing, rales, normal symmetric air entry Abdomen - soft, non-tender; bowel sounds  normal; no masses,  no organomegaly Extremities - no joint deformities, effusion, or inflammation, no edema and no skin  discoloration  Neurologic Examination: Mental Status: Alert, oriented, thought content appropriate.  Speech fluent without evidence of aphasia. Able to follow commands without difficulty. Cranial Nerves: II-Visual fields were normal. III/IV/VI-Pupils were equal and reacted. Extraocular movements were full and conjugate.    V/VII-reduced perception of tactile sensation on the left side of the face compared to the right; no facial weakness. VIII-normal. X-normal speech and symmetrical palatal movement. XI: trapezius strength/neck flexion strength normal bilaterally XII-midline tongue extension with normal strength. Motor: 5/5 bilaterally with normal tone and bulk Sensory: Reduced perception tactile sensation over left upper and lower extremities compared to right extremities area. Deep Tendon Reflexes: 2+ and symmetric. Plantars: Mute bilaterally Cerebellar: Normal finger-to-nose testing. Carotid auscultation: Normal  Results for orders placed or performed during the hospital encounter of 08/04/14 (from the past 48 hour(s))  I-stat troponin, ED  (not at [facility], [facility])     Status: None   Collection Time: 08/04/14 12:02 PM  Result Value Ref Range   Troponin i, poc 0.00 0.00 - 0.08 ng/mL   Comment 3            Comment: Due to the release kinetics of cTnI, a negative result within the first hours of the onset of symptoms does not rule out myocardial infarction with certainty. If myocardial infarction is still suspected, repeat the test at appropriate intervals.   Ethanol     Status: None   Collection Time: 08/04/14  1:25 PM  Result Value Ref Range   Alcohol, Ethyl (B) <5 <5 mg/dL    Comment:        LOWEST DETECTABLE LIMIT FOR SERUM ALCOHOL IS 11 mg/dL FOR MEDICAL PURPOSES ONLY   Protime-INR     Status: None   Collection Time: 08/04/14  1:25 PM  Result Value Ref Range   Prothrombin Time 12.9 11.6 - 15.2 seconds   INR 0.97 0.00 - 1.49  APTT     Status: None   Collection Time:  08/04/14  1:25 PM  Result Value Ref Range   aPTT 32 24 - 37 seconds  CBC     Status: Abnormal   Collection Time: 08/04/14  1:25 PM  Result Value Ref Range   WBC 10.8 (H) 4.0 - 10.5 K/uL   RBC 4.21 3.87 - 5.11 MIL/uL   Hemoglobin 11.2 (L) 12.0 - 15.0 g/dL   HCT 36.1 36.0 - 46.0 %   MCV 85.7 78.0 - 100.0 fL   MCH 26.6 26.0 - 34.0 pg   MCHC 31.0 30.0 - 36.0 g/dL   RDW 15.1 11.5 - 15.5 %   Platelets 293 150 - 400 K/uL  Differential     Status: Abnormal   Collection Time: 08/04/14  1:25 PM  Result Value Ref Range   Neutrophils Relative % 56 43 - 77 %   Neutro Abs 6.0 1.7 - 7.7 K/uL   Lymphocytes Relative 38 12 - 46 %   Lymphs Abs 4.1 (H) 0.7 - 4.0 K/uL   Monocytes Relative 5 3 - 12 %   Monocytes Absolute 0.5 0.1 - 1.0 K/uL   Eosinophils Relative 1 0 - 5 %   Eosinophils Absolute 0.1 0.0 - 0.7 K/uL   Basophils Relative 0 0 - 1 %   Basophils Absolute 0.0 0.0 - 0.1 K/uL  Comprehensive metabolic panel     Status: Abnormal   Collection Time: 08/04/14  1:25  PM  Result Value Ref Range   Sodium 142 135 - 145 mmol/L   Potassium 4.3 3.5 - 5.1 mmol/L   Chloride 106 101 - 111 mmol/L   CO2 28 22 - 32 mmol/L   Glucose, Bld 110 (H) 70 - 99 mg/dL   BUN 13 6 - 20 mg/dL   Creatinine, Ser 0.82 0.44 - 1.00 mg/dL   Calcium 9.6 8.9 - 10.3 mg/dL   Total Protein 7.2 6.5 - 8.1 g/dL   Albumin 3.5 3.5 - 5.0 g/dL   AST 12 (L) 15 - 41 U/L   ALT 14 14 - 54 U/L   Alkaline Phosphatase 69 38 - 126 U/L   Total Bilirubin 0.4 0.3 - 1.2 mg/dL   GFR calc non Af Amer >60 >60 mL/min   GFR calc Af Amer >60 >60 mL/min    Comment: (NOTE) The eGFR has been calculated using the CKD EPI equation. This calculation has not been validated in all clinical situations. eGFR's persistently <60 mL/min signify possible Chronic Kidney Disease.    Anion gap 8 5 - 15  I-stat troponin, ED (not at [facility], [facility])     Status: None   Collection Time: 08/04/14  1:30 PM  Result Value Ref Range   Troponin i, poc 0.00 0.00 - 0.08 ng/mL    Comment 3            Comment: Due to the release kinetics of cTnI, a negative result within the first hours of the onset of symptoms does not rule out myocardial infarction with certainty. If myocardial infarction is still suspected, repeat the test at appropriate intervals.   I-Stat Chem 8, ED  (not at [facility], [facility])     Status: Abnormal   Collection Time: 08/04/14  1:32 PM  Result Value Ref Range   Sodium 143 135 - 145 mmol/L   Potassium 4.0 3.5 - 5.1 mmol/L   Chloride 105 101 - 111 mmol/L   BUN 12 6 - 20 mg/dL   Creatinine, Ser 0.80 0.44 - 1.00 mg/dL   Glucose, Bld 106 (H) 70 - 99 mg/dL   Calcium, Ion 1.30 (H) 1.12 - 1.23 mmol/L   TCO2 25 0 - 100 mmol/L   Hemoglobin 12.6 12.0 - 15.0 g/dL   HCT 37.0 36.0 - 46.0 %  Urine Drug Screen     Status: None   Collection Time: 08/04/14  2:28 PM  Result Value Ref Range   Opiates NONE DETECTED NONE DETECTED   Cocaine NONE DETECTED NONE DETECTED   Benzodiazepines NONE DETECTED NONE DETECTED   Amphetamines NONE DETECTED NONE DETECTED   Tetrahydrocannabinol NONE DETECTED NONE DETECTED   Barbiturates NONE DETECTED NONE DETECTED    Comment:        DRUG SCREEN FOR MEDICAL PURPOSES ONLY.  IF CONFIRMATION IS NEEDED FOR ANY PURPOSE, NOTIFY LAB WITHIN 5 DAYS.        LOWEST DETECTABLE LIMITS FOR URINE DRUG SCREEN Drug Class       Cutoff (ng/mL) Amphetamine      1000 Barbiturate      200 Benzodiazepine   976 Tricyclics       734 Opiates          300 Cocaine          300 THC              50   Urinalysis, Routine w reflex microscopic     Status: Abnormal   Collection Time: 08/04/14  2:28 PM  Result  Value Ref Range   Color, Urine YELLOW YELLOW   APPearance CLEAR CLEAR   Specific Gravity, Urine 1.026 1.005 - 1.030   pH 6.5 5.0 - 8.0   Glucose, UA 100 (A) NEGATIVE mg/dL   Hgb urine dipstick NEGATIVE NEGATIVE   Bilirubin Urine NEGATIVE NEGATIVE   Ketones, ur NEGATIVE NEGATIVE mg/dL   Protein, ur NEGATIVE NEGATIVE mg/dL   Urobilinogen,  UA 0.2 0.0 - 1.0 mg/dL   Nitrite NEGATIVE NEGATIVE   Leukocytes, UA NEGATIVE NEGATIVE    Comment: MICROSCOPIC NOT DONE ON URINES WITH NEGATIVE PROTEIN, BLOOD, LEUKOCYTES, NITRITE, OR GLUCOSE <1000 mg/dL.   Ct Head Wo Contrast  08/04/2014   CLINICAL DATA:  Acute onset left-sided weakness earlier today  EXAM: CT HEAD WITHOUT CONTRAST  TECHNIQUE: Contiguous axial images were obtained from the base of the skull through the vertex without intravenous contrast.  COMPARISON:  January 15, 2011  FINDINGS: The ventricles are normal in size and configuration. There is no intracranial mass, hemorrhage, extra-axial fluid collection, or midline shift. Gray-white compartments are normal. No acute infarct apparent. The bony calvarium appears intact. The mastoid air cells are clear.  IMPRESSION: Study within normal limits. No intracranial mass, hemorrhage, or focal gray - white compartment lesions/acute appearing infarct.   Electronically Signed   By: Lowella Grip III M.D.   On: 08/04/2014 13:49   Mm Screening Breast Tomo Bilateral  08/03/2014   CLINICAL DATA:  Screening.  EXAM: DIGITAL SCREENING BILATERAL MAMMOGRAM WITH 3D TOMO WITH CAD  COMPARISON:  Previous exam(s).  ACR Breast Density Category b: There are scattered areas of fibroglandular density.  FINDINGS: There are no findings suspicious for malignancy. Waxing and waning oval and round masses are again noted bilaterally, most compatible with cysts or other benign findings. Images were processed with CAD.  IMPRESSION: No mammographic evidence of malignancy. A result letter of this screening mammogram will be mailed directly to the patient.  RECOMMENDATION: Screening mammogram in one year. (Code:SM-B-01Y)  BI-RADS CATEGORY  1: Negative.   Electronically Signed   By: Lovey Newcomer M.D.   On: 08/03/2014 13:29    Assessment: 49 y.o. female with multiple risk factors for stroke as well as history of previous stroke, presenting with possible recurrent acute right  cerebral ischemic event.  Stroke Risk Factors - diabetes mellitus and hypertension  Plan: 1. HgbA1c, fasting lipid panel 2. MRI, MRA  of the brain without contrast 3. PT consult, OT consult, Speech consult 4. Echocardiogram 5. Carotid dopplers 6. Prophylactic therapy-Antiplatelet med: Aspirin  7. Laboratory studies to rule out hypercoagulable state 8. Telemetry monitoring  C.R. Nicole Kindred, MD [facility] [phone]  08/04/2014, 4:10 PM

## 2014-08-04 NOTE — ED Provider Notes (Signed)
CSN: [id number]     Arrival date & time 08/04/14  1136 History   First MD Initiated Contact with Patient 08/04/14 1240     Chief Complaint  Patient presents with  . elevated heart rate      (Consider location/radiation/quality/duration/timing/severity/associated sxs/prior Treatment) HPI Comments: Patient history of stroke, hypertension, fibromyalgia presents to the emergency department with chief complaint of left sided numbness and tingling.  She has a history of stroke. Its that she has residual left facial numbness from past stroke. Reports having new left upper and lower extremity numbness. Last known normal last night before bed. She states that she has had a small amount of slurred speech, which is been witnessed by a friend. She also reports some mild fleeting chest pain. There are no aggravating or alleviating factors. She has not taken anything to alleviate her symptoms.  The history is provided by the patient. No language interpreter was used.    Past Medical History  Diagnosis Date  . Fibromyalgia   . Acid reflux   . Scoliosis   . Heart murmur   . Anxiety   . Chronic headaches   . Anemia   . Stroke 1997  . Hypertension   . Degenerative disc disease     lumbar   Past Surgical History  Procedure Laterality Date  . Abdominal hysterectomy    . Cesarean section    . Laparoscopic unilateral salpingo oopherectomy Right 2005   No family history on file. History  Substance Use Topics  . Smoking status: Never Smoker   . Smokeless tobacco: Not on file  . Alcohol Use: No   OB History    No data available     Review of Systems  Constitutional: Negative for fever and chills.  Respiratory: Negative for shortness of breath.   Cardiovascular: Negative for chest pain.  Gastrointestinal: Negative for nausea, vomiting, diarrhea and constipation.  Genitourinary: Negative for dysuria.  Neurological: Positive for numbness. Negative for weakness.      Allergies   Milnacipran; Pregabalin; Topiramate; and Metronidazole  Home Medications   Prior to Admission medications   Medication Sig Start Date End Date Taking? Authorizing Provider  albuterol (PROVENTIL HFA;VENTOLIN HFA) 108 (90 BASE) MCG/ACT inhaler Inhale 1-2 puffs into the lungs every 6 (six) hours as needed for wheezing or shortness of breath.   Yes Historical Provider, MD  Ascorbic Acid (VITAMIN C) 1000 MG tablet Take 1,000 mg by mouth daily.   Yes Historical Provider, MD  cyclobenzaprine (FLEXERIL) 10 MG tablet Take 10 mg by mouth 3 (three) times daily as needed for muscle spasms.   Yes Historical Provider, MD  Fish Oil-Cholecalciferol (FISH OIL + D3) 1200-1000 MG-UNIT CAPS Take 1 capsule by mouth daily.    Yes Historical Provider, MD  furosemide (LASIX) 20 MG tablet Take 20 mg by mouth every other day.   Yes Historical Provider, MD  meloxicam (MOBIC) 7.5 MG tablet Take 7.5 mg by mouth daily.   Yes Historical Provider, MD  metFORMIN (GLUCOPHAGE) 500 MG tablet Take 500 mg by mouth daily.   Yes Historical Provider, MD  naproxen (NAPROSYN) 500 MG tablet Take 500 mg by mouth every 12 (twelve) hours as needed for moderate pain.   Yes Historical Provider, MD  pantoprazole (PROTONIX) 40 MG tablet Take 40 mg by mouth 2 (two) times daily.   Yes Historical Provider, MD  benzonatate (TESSALON) 100 MG capsule Take 1 capsule (100 mg total) by mouth 3 (three) times daily as needed for cough. Patient  not taking: Reported on 08/04/2014 04/12/13   Samuel JesterKathleen McManus, DO  ibuprofen (ADVIL,MOTRIN) 200 MG tablet Take 200-400 mg by mouth every 6 (six) hours as needed for fever, headache, mild pain or moderate pain.    Historical Provider, MD   BP 144/84 mmHg  Pulse 74  Temp(Src) 97.9 F (36.6 C) (Oral)  Resp 18  SpO2 100% Physical Exam  Constitutional: She is oriented to person, place, and time. She appears well-developed and well-nourished.  HENT:  Head: Normocephalic and atraumatic.  Eyes: Conjunctivae and EOM  are normal. Pupils are equal, round, and reactive to light.  Neck: Normal range of motion. Neck supple.  Cardiovascular: Normal rate and regular rhythm.  Exam reveals no gallop and no friction rub.   No murmur heard. Pulmonary/Chest: Effort normal and breath sounds normal. No respiratory distress. She has no wheezes. She has no rales. She exhibits no tenderness.  Abdominal: Soft. Bowel sounds are normal. She exhibits no distension and no mass. There is no tenderness. There is no rebound and no guarding.  Musculoskeletal: Normal range of motion. She exhibits no edema or tenderness.  Moves all extremities  Neurological: She is alert and oriented to person, place, and time.  Decreased sensation on left side of face (baseline), decreased sensation left upper and lower extremities (new), strength intact throughout CN III-12 intact, speech is clear, movements are goal oriented, normal finger to nose, normal heel to shin, no pronator drift  Skin: Skin is warm and dry.  Psychiatric: She has a normal mood and affect. Her behavior is normal. Judgment and thought content normal.  Nursing note and vitals reviewed.   ED Course  Procedures (including critical care time) Labs Review Labs Reviewed  CBC - Abnormal; Notable for the following:    WBC 10.8 (*)    Hemoglobin 11.2 (*)    All other components within normal limits  DIFFERENTIAL - Abnormal; Notable for the following:    Lymphs Abs 4.1 (*)    All other components within normal limits  COMPREHENSIVE METABOLIC PANEL - Abnormal; Notable for the following:    Glucose, Bld 110 (*)    AST 12 (*)    All other components within normal limits  I-STAT CHEM 8, ED - Abnormal; Notable for the following:    Glucose, Bld 106 (*)    Calcium, Ion 1.30 (*)    All other components within normal limits  ETHANOL  PROTIME-INR  APTT  URINE RAPID DRUG SCREEN (HOSP PERFORMED)  URINALYSIS, ROUTINE W REFLEX MICROSCOPIC  I-STAT TROPOININ, ED  I-STAT TROPOININ,  ED    Imaging Review Ct Head Wo Contrast  08/04/2014   CLINICAL DATA:  Acute onset left-sided weakness earlier today  EXAM: CT HEAD WITHOUT CONTRAST  TECHNIQUE: Contiguous axial images were obtained from the base of the skull through the vertex without intravenous contrast.  COMPARISON:  January 15, 2011  FINDINGS: The ventricles are normal in size and configuration. There is no intracranial mass, hemorrhage, extra-axial fluid collection, or midline shift. Gray-white compartments are normal. No acute infarct apparent. The bony calvarium appears intact. The mastoid air cells are clear.  IMPRESSION: Study within normal limits. No intracranial mass, hemorrhage, or focal gray - white compartment lesions/acute appearing infarct.   Electronically Signed   By: Bretta BangWilliam  Woodruff III M.D.   On: 08/04/2014 13:49   Mm Screening Breast Tomo Bilateral  08/03/2014   CLINICAL DATA:  Screening.  EXAM: DIGITAL SCREENING BILATERAL MAMMOGRAM WITH 3D TOMO WITH CAD  COMPARISON:  Previous  exam(s).  ACR Breast Density Category b: There are scattered areas of fibroglandular density.  FINDINGS: There are no findings suspicious for malignancy. Waxing and waning oval and round masses are again noted bilaterally, most compatible with cysts or other benign findings. Images were processed with CAD.  IMPRESSION: No mammographic evidence of malignancy. A result letter of this screening mammogram will be mailed directly to the patient.  RECOMMENDATION: Screening mammogram in one year. (Code:SM-B-01Y)  BI-RADS CATEGORY  1: Negative.   Electronically Signed   By: Annia Belt M.D.   On: 08/03/2014 13:29     EKG Interpretation   Date/Time:  Wednesday Aug 04 2014 11:51:27 EDT Ventricular Rate:  80 PR Interval:  132 QRS Duration: 79 QT Interval:  366 QTC Calculation: 422 R Axis:   15 Text Interpretation:  Sinus rhythm Baseline wander in lead(s) V1 Normal  ECG Confirmed by POLLINA  MD, CHRISTOPHER [phone]) on 08/04/2014 12:14:31 PM       MDM   Final diagnoses:  Numbness of left lower extremity  Left upper extremity numbness    Patient with history of prior stroke complains of new onset left arm and leg numbness. Symptoms were first present this morning when patient woke up. Last normal night. This places her outside of the TPA window.  2:58 PM Patient's labs are reassuring.  She does still have persistent left sided sensation deficit.  Patient discussed with Dr. Romeo Apple, who recommends admission given prior hx of stroke and persistent symptoms.  CT scan is negative, workup has thus far reassuring, however patient does still have persistent left-sided sensation deficits. Patient discussed with [facility], who will admit the patient. Appreciate Dr. Onalee Hua for admitting the patient.  Discussed the patient with Dr. Roseanne Reno from neurology, who will consult.   Roxy Horseman, PA-C 08/04/14 1532  Purvis Sheffield, MD 08/05/14 [phone]

## 2014-08-04 NOTE — ED Notes (Signed)
Per pt, states she was at PCPs office and they sent her here for high HR

## 2014-08-04 NOTE — ED Notes (Addendum)
Upon initial assessment pt reports left side arm numbness/tingling with chest pain. Upon present assessment, pt reports new subjective data of chronic sensation change in left side of face since stroke @ age 49, but acute sensation change (felt less than right side of arm/leg) of left arm/leg since 0700 this morning.   Pt transferred to CT at present time. STROKE SWALLOW SCREEN TO BE PREFORMED WITH PT RETURN.

## 2014-08-05 ENCOUNTER — Observation Stay (HOSPITAL_COMMUNITY): Payer: Medicare Other

## 2014-08-05 ENCOUNTER — Ambulatory Visit (HOSPITAL_COMMUNITY): Payer: Medicare Other

## 2014-08-05 DIAGNOSIS — I6789 Other cerebrovascular disease: Secondary | ICD-10-CM | POA: Diagnosis not present

## 2014-08-05 DIAGNOSIS — R200 Anesthesia of skin: Secondary | ICD-10-CM | POA: Diagnosis not present

## 2014-08-05 DIAGNOSIS — F449 Dissociative and conversion disorder, unspecified: Secondary | ICD-10-CM

## 2014-08-05 DIAGNOSIS — R208 Other disturbances of skin sensation: Secondary | ICD-10-CM | POA: Diagnosis not present

## 2014-08-05 DIAGNOSIS — Z8739 Personal history of other diseases of the musculoskeletal system and connective tissue: Secondary | ICD-10-CM | POA: Diagnosis not present

## 2014-08-05 DIAGNOSIS — E785 Hyperlipidemia, unspecified: Secondary | ICD-10-CM

## 2014-08-05 DIAGNOSIS — G4730 Sleep apnea, unspecified: Secondary | ICD-10-CM

## 2014-08-05 LAB — GLUCOSE, CAPILLARY
Glucose-Capillary: 133 mg/dL — ABNORMAL HIGH (ref 65–99)
Glucose-Capillary: 128 mg/dL — ABNORMAL HIGH (ref 65–99)
Glucose-Capillary: 100 mg/dL — ABNORMAL HIGH (ref 65–99)

## 2014-08-05 LAB — LIPID PANEL
Cholesterol: 187 mg/dL (ref 0–200)
HDL: 43 mg/dL (ref >40)
LDL Cholesterol: 123 mg/dL — ABNORMAL HIGH (ref 0–99)
TRIGLYCERIDES: 107 mg/dL (ref <150)
Total CHOL/HDL Ratio: 4.3 RATIO
VLDL: 21 mg/dL (ref 0–40)

## 2014-08-05 MED ORDER — ASPIRIN EC 81 MG PO TBEC: 81.0000 mg | DELAYED_RELEASE_TABLET | Freq: Every day | ORAL | Status: AC

## 2014-08-05 MED ORDER — INSULIN ASPART 100 UNIT/ML ~~LOC~~ SOLN: 0.0000 [IU] | Freq: Every day | SUBCUTANEOUS | Status: DC

## 2014-08-05 MED ORDER — SIMVASTATIN 10 MG PO TABS: 10.0000 mg | ORAL_TABLET | Freq: Every day | ORAL | Status: DC

## 2014-08-05 MED ORDER — INSULIN ASPART 100 UNIT/ML ~~LOC~~ SOLN
0.0000 [IU] | Freq: Three times a day (TID) | SUBCUTANEOUS | Status: DC
  Administered 2014-08-05 (×2): 3 [IU] via SUBCUTANEOUS

## 2014-08-05 MED ORDER — SIMVASTATIN 5 MG PO TABS
10.0000 mg | ORAL_TABLET | Freq: Every day | ORAL | Status: DC
  Administered 2014-08-05: 10 mg via ORAL
  Filled 2014-08-05: qty 2

## 2014-08-05 NOTE — Progress Notes (Signed)
  Echocardiogram 2D Echocardiogram has been performed.  Janalyn HarderWest, Natividad Halls R 08/05/2014, 3:15 PM

## 2014-08-05 NOTE — Evaluation (Signed)
Physical Therapy Evaluation Patient Details Name: Helen EpsteinKaren L [name] MRN: [id number] DOB: 06/07/1965 Today's Date: 08/05/2014   History of Present Illness  49 yo female admitted due to L side weakness. MRI (_) PMH CVA with L residual weakness, HTN Fibromyalgia gerd  Clinical Impression  Pt generally usnteady and moves slowly, but pt indicates that is normal for her.  Pt does indicates she fatigues more quickly than normal, but will have support from aide and family at D/C.  Will continue to follow while on acute.      Follow Up Recommendations Home health PT;Supervision - Intermittent    Equipment Recommendations  None recommended by PT    Recommendations for Other Services       Precautions / Restrictions Precautions Precautions: None Restrictions Weight Bearing Restrictions: No      Mobility  Bed Mobility Overal bed mobility: Modified Independent                Transfers Overall transfer level: Modified independent Equipment used: Rolling walker (2 wheeled)             General transfer comment: pt uses RW to come to stand and states this is how she normally does it.    Ambulation/Gait Ambulation/Gait assistance: Supervision Ambulation Distance (Feet): 60 Feet (x2) Assistive device: Rolling walker (2 wheeled) Gait Pattern/deviations: Step-through pattern;Decreased stride length;Trunk flexed     General Gait Details: pt moves slowly and indicates hip stiffness.    Stairs            Wheelchair Mobility    Modified Rankin (Stroke Patients Only) Modified Rankin (Stroke Patients Only) Pre-Morbid Rankin Score: Moderate disability Modified Rankin: Moderate disability     Balance Overall balance assessment: Needs assistance Sitting-balance support: No upper extremity supported;Feet supported Sitting balance-Leahy Scale: Good     Standing balance support: During functional activity;Bilateral upper extremity supported Standing balance-Leahy Scale:  Poor                               Pertinent Vitals/Pain Pain Assessment: No/denies pain    Home Living Family/patient expects to be discharged to:: Private residence Living Arrangements: Children Available Help at Discharge: Family;Friend(s);Available PRN/intermittently Type of Home: House Home Access: Stairs to enter Entrance Stairs-Rails: Right;Left;Can reach both Entrance Stairs-Number of Steps: 3 Home Layout: One level Home Equipment: Walker - 2 wheels Additional Comments: has an Aide that come for an hour in the morning for ADLS    Prior Function Level of Independence: Needs assistance      ADL's / Homemaking Assistance Needed: bathing / dressing        Hand Dominance   Dominant Hand: Right    Extremity/Trunk Assessment   Upper Extremity Assessment: Defer to OT evaluation       LUE Deficits / Details: numbness from shoulder to finger tips but reports it has improved. Functionally during session WFL. Pt only describes sensation changes   Lower Extremity Assessment: LLE deficits/detail   LLE Deficits / Details: Tingling on bottom of her foot only.    Cervical / Trunk Assessment: Normal  Communication   Communication: No difficulties  Cognition Arousal/Alertness: Awake/alert Behavior During Therapy: WFL for tasks assessed/performed Overall Cognitive Status: Within Functional Limits for tasks assessed                      General Comments      Exercises  Assessment/Plan    PT Assessment Patient needs continued PT services  PT Diagnosis Difficulty walking   PT Problem List Decreased activity tolerance;Decreased balance;Decreased mobility;Decreased knowledge of use of DME;Impaired sensation;Obesity  PT Treatment Interventions DME instruction;Gait training;Stair training;Functional mobility training;Therapeutic activities;Therapeutic exercise;Balance training;Neuromuscular re-education;Patient/family education   PT Goals  (Current goals can be found in the Care Plan section) Acute Rehab PT Goals Patient Stated Goal: Home PT Goal Formulation: With patient Time For Goal Achievement: 08/12/14 Potential to Achieve Goals: Good    Frequency Min 4X/week   Barriers to discharge        Co-evaluation               End of Session Equipment Utilized During Treatment: Gait belt Activity Tolerance: Patient tolerated treatment well Patient left: in bed (with transport) Nurse Communication: Mobility status    Functional Assessment Tool Used: Clinical Judgement Functional Limitation: Mobility: Walking and moving around Mobility: Walking and Moving Around Current Status ([id number](G8978): At least 1 percent but less than 20 percent impaired, limited or restricted Mobility: Walking and Moving Around Goal Status [phone](G8979): 0 percent impaired, limited or restricted    Time: [id number] PT Time Calculation (min) (ACUTE ONLY): 22 min   Charges:   PT Evaluation $Initial PT Evaluation Tier I: 1 Procedure     PT G Codes:   PT G-Codes **NOT FOR INPATIENT CLASS** Functional Assessment Tool Used: Clinical Judgement Functional Limitation: Mobility: Walking and moving around Mobility: Walking and Moving Around Current Status (N8295(G8978): At least 1 percent but less than 20 percent impaired, limited or restricted Mobility: Walking and Moving Around Goal Status [phone](G8979): 0 percent impaired, limited or restricted    Sunny SchleinRitenour, Yudith Norlander F, South CarolinaPT [phone] 08/05/2014, 3:47 PM

## 2014-08-05 NOTE — Progress Notes (Signed)
STROKE TEAM PROGRESS NOTE   HISTORY Helen [name] is an 49 y.o. female with a history of hypertension, diabetes mellitus, stroke in 1997, fibromyalgia and degenerative disc disease, presenting with new onset numbness involving left upper and lower extremities. Patient has residual numbness involving left side of her face from previous stroke. She also noted that her left leg felt slightly weak today. She was last seen well about 9 PM on 08/03/2014. She has not been on antiplatelet therapy. CT scan of her head showed no acute intracranial abnormality. NIH stroke score was 1 for left-sided numbness. Patient was not administered TPA secondary to Minimal deficits and beyond time under for treatment consideration. She was admitted for further evaluation and treatment.   SUBJECTIVE (INTERVAL HISTORY) No family is at the bedside.  Overall she feels her condition is rapidly improving. She is up in the chair eating lunch. She stated that she had a stroke 20 years ago with right basal ganglia involvement and the left sided numbness and weakness, however, she was told stroke was caused by stress. MRI showed no evidence of any previous stroke. She stated that for the last 20 years, she had intermittent left-sided numbness and weakness. This time she started to have left arm pain, tingling, chest pain, shortness of breath, wheezing in her doctor's office, she was sent to ER for evaluation.   OBJECTIVE Temp:  [97.7 F (36.5 C)-98.7 F (37.1 C)] 98 F (36.7 C) (05/12 1024) Pulse Rate:  [68-87] 87 (05/12 1024) Cardiac Rhythm:  [-] Normal sinus rhythm (05/12 0806) Resp:  [16-20] 16 (05/12 1024) BP: (123-148)/(64-92) 133/81 mmHg (05/12 1024) SpO2:  [96 %-100 %] 100 % (05/12 1024) Weight:  [124.9 kg (275 lb 5.7 oz)-124.966 kg (275 lb 8 oz)] 124.9 kg (275 lb 5.7 oz) (05/12 1024)   Recent Labs Lab 08/04/14 2147 08/05/14 0633  GLUCAP 110* 133*    Recent Labs Lab 08/04/14 1325 08/04/14 1332  NA 142 143   K 4.3 4.0  CL 106 105  CO2 28  --   GLUCOSE 110* 106*  BUN 13 12  CREATININE 0.82 0.80  CALCIUM 9.6  --     Recent Labs Lab 08/04/14 1325  AST 12*  ALT 14  ALKPHOS 69  BILITOT 0.4  PROT 7.2  ALBUMIN 3.5    Recent Labs Lab 08/04/14 1325 08/04/14 1332  WBC 10.8*  --   NEUTROABS 6.0  --   HGB 11.2* 12.6  HCT 36.1 37.0  MCV 85.7  --   PLT 293  --    No results for input(s): CKTOTAL, CKMB, CKMBINDEX, TROPONINI in the last 168 hours.  Recent Labs  08/04/14 1325  LABPROT 12.9  INR 0.97    Recent Labs  08/04/14 1428  COLORURINE YELLOW  LABSPEC 1.026  PHURINE 6.5  GLUCOSEU 100*  HGBUR NEGATIVE  BILIRUBINUR NEGATIVE  KETONESUR NEGATIVE  PROTEINUR NEGATIVE  UROBILINOGEN 0.2  NITRITE NEGATIVE  LEUKOCYTESUR NEGATIVE       Component Value Date/Time   CHOL 187 08/05/2014 0428   TRIG 107 08/05/2014 0428   HDL 43 08/05/2014 0428   CHOLHDL 4.3 08/05/2014 0428   VLDL 21 08/05/2014 0428   LDLCALC 123* 08/05/2014 0428   No results found for: HGBA1C    Component Value Date/Time   LABOPIA NONE DETECTED 08/04/2014 1428   COCAINSCRNUR NONE DETECTED 08/04/2014 1428   LABBENZ NONE DETECTED 08/04/2014 1428   AMPHETMU NONE DETECTED 08/04/2014 1428   THCU NONE DETECTED 08/04/2014 1428  LABBARB NONE DETECTED 08/04/2014 1428     Recent Labs Lab 08/04/14 1325  ETH <5   I have personally reviewed the radiological images below and agree with the radiology interpretations.  Ct Head Wo Contrast 08/04/2014    Study within normal limits. No intracranial mass, hemorrhage, or focal gray - white compartment lesions/acute appearing infarct.     MRI HEAD  08/05/2014   Normal MRI with no acute intracranial infarct or other process identified.    MRA HEAD  08/05/2014   1. No proximal branch occlusion or hemodynamically significant stenosis identified within the intracranial circulation. 2. Fetal origin of the PCAs bilaterally with somewhat diminutive vertebrobasilar system.      Carotid Doppler  There is 1-39% bilateral ICA stenosis. Vertebral artery flow is antegrade.   2D echo - - Left ventricle: The cavity size was normal. Systolic function was normal. The estimated ejection fraction was in the range of 55% to 60%. Wall motion was normal; there were no regional wall motion abnormalities. - Atrial septum: No defect or patent foramen ovale was identified.  PHYSICAL EXAM  Temp:  [97.7 F (36.5 C)-98.2 F (36.8 C)] 98.2 F (36.8 C) (05/12 1510) Pulse Rate:  [68-87] 78 (05/12 1510) Resp:  [16-18] 18 (05/12 1510) BP: (123-148)/(71-86) 133/72 mmHg (05/12 1510) SpO2:  [96 %-100 %] 100 % (05/12 1510) Weight:  [275 lb 5.7 oz (124.9 kg)] 275 lb 5.7 oz (124.9 kg) (05/12 1024)  General - Well nourished, well developed, in no apparent distress.  Ophthalmologic - Sharp disc margins OU.   Cardiovascular - Regular rate and rhythm with no murmur.  Mental Status -  Level of arousal and orientation to time, place, and person were intact. Language including expression, naming, repetition, comprehension was assessed and found intact. Fund of Knowledge was assessed and was intact.  Cranial Nerves II - XII - II - Visual field intact OU. III, IV, VI - Extraocular movements intact. V - Facial sensation decreased on the left. VII - Facial movement intact bilaterally. VIII - Hearing & vestibular intact bilaterally. X - Palate elevates symmetrically. XI - Chin turning & shoulder shrug intact bilaterally. XII - Tongue protrusion intact.  Motor Strength - The patient's strength was normal in all extremities and pronator drift was absent.  Bulk was normal and fasciculations were absent.   Motor Tone - Muscle tone was assessed at the neck and appendages and was normal.  Reflexes - The patient's reflexes were 1+ in all extremities and she had no pathological reflexes.  Sensory - Light touch, temperature/pinprick were assessed and were subjectively decreased on the  left.    Coordination - The patient had normal movements in the hands and feet with no ataxia or dysmetria.  Tremor was absent.  Gait and Station - deferred due to patient eating lunch.   ASSESSMENT/PLAN Helen [name] is a 49 y.o. female with history of hypertension, diabetes mellitus, stroke in 1997, fibromyalgia and degenerative disc disease presenting with new onset left side numbness. She did not receive IV t-PA due to minimal deficits and beyond time under for treatment consideration.   Left sided numbness without stroke/TIA etiology. Suspect conversion disorder.  MRI  No acute stroke  MRA  No significant stenosis   Carotid Doppler  No significant stenosis   2D Echo  unremarkable   LDL 123, not at goal  HgbA1c pending  SCDs VTE prophylaxis  Diet heart healthy/carb modified Room service appropriate?: Yes; Fluid consistency:: Thin  no antithrombotic prior  to admission, now on aspirin 325 mg orally every day. Continue aspirin on discharge.  Ongoing aggressive stroke risk factor management  Therapy recommendations:  No ST. Doubt therapy  needs  Disposition:  Return home  Hypertension  Home meds:   none  Stable  Hyperlipidemia  Home meds:  Fish oil  LDL 123, goal <100  Put on Zocor 10  Continue statin on discharge  Diabetes  No documented hx  Pt on glucophage PTA  Other Stroke Risk Factors  Morbid Obesity, Body mass index is 47.24 kg/(m^2).   Per pt rerports Hx stroke/TIA - 33 (age 32) R basal ganglia infarct with residual face numbness L. There is not imaging evidence of this stroke.  Other Active Problems  Fibromyalgia  GERD  Chronic headaches  Hospital day #   Neurology will sign off. Please call with questions. No neurology follow-up indicated Thanks for the consult.  Marvel Plan, MD PhD Stroke Neurology 08/05/2014 5:31 PM    To contact Stroke Continuity provider, please refer to [email or web address]. After hours, contact General  Neurology

## 2014-08-05 NOTE — Evaluation (Addendum)
Speech Language Pathology Evaluation Patient Details Name: Helen EpsteinKaren L Halloran MRN: [id number] DOB: 1965/04/13 Today's Date: 08/05/2014 Time: [id number] SLP Time Calculation (min) (ACUTE ONLY): 18 min  Problem List:  Patient Active Problem List   Diagnosis Date Noted  . Numbness 08/04/2014  . H/O fibromyalgia 08/04/2014  . Left upper extremity numbness   . Numbness of left lower extremity   . UTI 07/01/2008  . DIARRHEA, ACUTE 06/11/2008  . GERD 05/28/2008  . PERSISTENT DISORDER INITIATING/MAINTAINING SLEEP 04/01/2008  . STROKE 04/01/2008  . HEADACHE, CHRONIC 04/01/2008  . Morbid obesity 03/16/2008  . BACK PAIN 12/17/2007  . Sleep apnea 12/17/2007  . 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 FIBROMYALGIA 12/03/2007   Past Medical History:  Past Medical History  Diagnosis Date  . Fibromyalgia   . Acid reflux   . Scoliosis   . Heart murmur   . Anxiety   . Chronic headaches   . Anemia   . Stroke 1997  . Hypertension   . Degenerative disc disease     lumbar   Past Surgical History:  Past Surgical History  Procedure Laterality Date  . Abdominal hysterectomy    . Cesarean section    . Laparoscopic unilateral salpingo oopherectomy Right 2005   HPI:  49 yo female h/o anxiety, CVA with mild left sided weakness, HTN, fibromyalgia, GERD admitted after awaking this am with worse than normal left arm and leg weakness. Per MD pt stopped taking her aspirin over 2 weeks ago, because she ran out at home. MRI normal MRI with no acute intracranial infarct or other process identified.   Assessment / Plan / Recommendation Clinical Impression  Pt reports baseline mild memory impairment following CVA 1997 in which she uses calendar on phone to compensate. SLP educated pt re: free app "Cozi" which is an Physiological scientistorganizer/planner for smartphones (brother present uses app and demonstrated use to pt). She scored in the mild deficit range on 4 word Cognistat recall. Other cognitive areas  were WFL's. Pt exhibited safety awareness by requesting nursing assist to restroom due to high fall risk and leg weakness. No ST needed at present. Encouraged pt to continue utilizing compensatory strategies for memory.     SLP Assessment  Patient does not need any further Speech Lanaguage Pathology Services    Follow Up Recommendations  None    Frequency and Duration        Pertinent Vitals/Pain     SLP Goals     SLP Evaluation Prior Functioning  Cognitive/Linguistic Baseline: Baseline deficits Baseline deficit details:  (memory impairment after CVA 1997) Type of Home: House  Lives With: Son;Daughter Vocation: Unemployed   Cognition  Overall Cognitive Status: Within Functional Limits for tasks assessed Arousal/Alertness: Awake/alert Orientation Level: Oriented X4 Attention: Sustained Sustained Attention: Appears intact Memory: Appears intact Awareness: Appears intact Problem Solving: Appears intact Safety/Judgment: Appears intact    Comprehension  Auditory Comprehension Overall Auditory Comprehension: Appears within functional limits for tasks assessed Visual Recognition/Discrimination Discrimination: Not tested Reading Comprehension Reading Status: Within funtional limits    Expression Expression Primary Mode of Expression: Verbal Verbal Expression Overall Verbal Expression: Appears within functional limits for tasks assessed Pragmatics: No impairment Written Expression Dominant Hand: Right Written Expression: Not tested   Oral / Motor Oral Motor/Sensory Function Overall Oral Motor/Sensory Function: Impaired Labial ROM: Within Functional Limits Labial Symmetry: Within Functional Limits Labial Strength: Within Functional Limits Labial Sensation: Reduced Lingual ROM: Within Functional Limits Lingual Symmetry: Within Functional Limits Lingual Strength: Within Functional  Limits Lingual Sensation: Within Functional Limits Facial ROM: Within Functional  Limits Facial Symmetry: Within Functional Limits Facial Strength: Within Functional Limits Facial Sensation: Reduced Velum: Within Functional Limits Mandible: Within Functional Limits Motor Speech Overall Motor Speech: Appears within functional limits for tasks assessed Intelligibility: Intelligible Motor Planning: Witnin functional limits   GO Functional Assessment Tool Used:  (skilled clinical judgement) Functional Limitations: Spoken language expressive Spoken Language Expression Current Status [phone](G9162): 0 percent impaired, limited or restricted Spoken Language Expression Goal Status (W1191(G9163): 0 percent impaired, limited or restricted Spoken Language Expression Discharge Status [phone](G9164): 0 percent impaired, limited or restricted   Royce MacadamiaLitaker, Willia Lampert Willis 08/05/2014, 10:03 AM  Breck CoonsLisa Willis Lonell FaceLitaker M.Ed [employer]-SLP Pager [phone]

## 2014-08-05 NOTE — Discharge Summary (Signed)
Physician Discharge Summary  Helen EpsteinKaren L [name] ONG:[id number] DOB: [birth year] DOA: 08/04/2014  PCP: Pcp Not In System  Admit date: 08/04/2014 Discharge date: 08/05/2014  Time spent: 35  minutes  Recommendations for Outpatient Follow-up:  Continue with risk factors modifications.   Discharge Diagnoses:   Left side weakness, ? TIA   Morbid obesity   GERD   Sleep apnea   H/O fibromyalgia   Left upper extremity numbness   Numbness of left lower extremity   Discharge Condition: Stable   Diet recommendation: heart healthy.   Filed Weights   08/04/14 1650 08/05/14 1024  Weight: 124.966 kg (275 lb 8 oz) 124.9 kg (275 lb 5.7 oz)    History of present illness:  49 yo female h/o anxiety, prev cva with mild left sided weakness, htn comes in after awaking this am with worse than normal left arm and leg weakness. No headache. No numbness or tingling. Just weaker than normal. No recent fevers. No n/v/d. No recent illnesses. No facial drooping, drooling and numb/tingling in face. Stopped taking her aspirin over 2 weeks ago, because she ran out at home.  Hospital Course:  Left sided numbness without stroke/TIA etiology.  MRI No acute stroke.   LDL at 123, start statins.   Discharge on aspirin.   HBA1c pending.   Diabetes ;continue with Glucophage.   Hypertension  Home meds:none  Stable  Procedures:  ECHO; no source of embolism  Doppler 1-39 % stenosis.   Consultations:  neurology  Discharge Exam: Filed Vitals:   08/05/14 1024  BP: 133/81  Pulse: 87  Temp: 98 F (36.7 C)  Resp: 16    General: NAD Cardiovascular: S 1, S 2 RRR Respiratory: CTA  Discharge Instructions    Current Discharge Medication List    CONTINUE these medications which have NOT CHANGED   Details  albuterol (PROVENTIL HFA;VENTOLIN HFA) 108 (90 BASE) MCG/ACT inhaler Inhale 1-2 puffs into the lungs every 6 (six) hours as needed for wheezing or shortness of breath.    Ascorbic  Acid (VITAMIN C) 1000 MG tablet Take 1,000 mg by mouth daily.    cyclobenzaprine (FLEXERIL) 10 MG tablet Take 10 mg by mouth 3 (three) times daily as needed for muscle spasms.    Fish Oil-Cholecalciferol (FISH OIL + D3) 1200-1000 MG-UNIT CAPS Take 1 capsule by mouth daily.     furosemide (LASIX) 20 MG tablet Take 20 mg by mouth every other day.    meloxicam (MOBIC) 7.5 MG tablet Take 7.5 mg by mouth daily.    metFORMIN (GLUCOPHAGE) 500 MG tablet Take 500 mg by mouth daily.    naproxen (NAPROSYN) 500 MG tablet Take 500 mg by mouth every 12 (twelve) hours as needed for moderate pain.    pantoprazole (PROTONIX) 40 MG tablet Take 40 mg by mouth 2 (two) times daily.    benzonatate (TESSALON) 100 MG capsule Take 1 capsule (100 mg total) by mouth 3 (three) times daily as needed for cough. Qty: 15 capsule, Refills: 0    ibuprofen (ADVIL,MOTRIN) 200 MG tablet Take 200-400 mg by mouth every 6 (six) hours as needed for fever, headache, mild pain or moderate pain.       Allergies  Allergen Reactions  . Milnacipran     REACTION: palpitations  . Pregabalin Other (See Comments)    unknown  . Topiramate     REACTION: caused "inside seizures"  . Metronidazole Rash      The results of significant diagnostics from this hospitalization (including imaging, microbiology,  ancillary and laboratory) are listed below for reference.    Significant Diagnostic Studies: Ct Head Wo Contrast  08/04/2014   CLINICAL DATA:  Acute onset left-sided weakness earlier today  EXAM: CT HEAD WITHOUT CONTRAST  TECHNIQUE: Contiguous axial images were obtained from the base of the skull through the vertex without intravenous contrast.  COMPARISON:  January 15, 2011  FINDINGS: The ventricles are normal in size and configuration. There is no intracranial mass, hemorrhage, extra-axial fluid collection, or midline shift. Gray-white compartments are normal. No acute infarct apparent. The bony calvarium appears intact. The  mastoid air cells are clear.  IMPRESSION: Study within normal limits. No intracranial mass, hemorrhage, or focal gray - white compartment lesions/acute appearing infarct.   Electronically Signed   By: Bretta BangWilliam  Woodruff III M.D.   On: 08/04/2014 13:49   Mr Brain Wo Contrast  08/05/2014   CLINICAL DATA:  Initial evaluation for acute onset left upper and lower extremity numbness.  EXAM: MRI HEAD WITHOUT CONTRAST  MRA HEAD WITHOUT CONTRAST  TECHNIQUE: Multiplanar, multiecho pulse sequences of the brain and surrounding structures were obtained without intravenous contrast. Angiographic images of the head were obtained using MRA technique without contrast.  COMPARISON:  Prior CT from 08/04/2014  FINDINGS: MRI HEAD FINDINGS  The CSF containing spaces are within normal limits for patient age. No focal parenchymal signal abnormality is identified. No mass lesion, midline shift, or extra-axial fluid collection. Ventricles are normal in size without evidence of hydrocephalus.  No diffusion-weighted signal abnormality is identified to suggest acute intracranial infarct. Gray-white matter differentiation is maintained. Normal flow voids are seen within the intracranial vasculature. No intracranial hemorrhage identified.  The cervicomedullary junction is normal. Pituitary gland is within normal limits. Pituitary stalk is midline. The globes and optic nerves demonstrate a normal appearance with normal signal intensity.  The bone marrow signal intensity is normal. Calvarium is intact. Visualized upper cervical spine is within normal limits.  Scalp soft tissues are unremarkable.  Paranasal sinuses are clear.  No mastoid effusion.  MRA HEAD FINDINGS  ANTERIOR CIRCULATION:  Visualized distal portions of the cervical segments of the internal carotid arteries are widely patent with antegrade flow. The petrous, cavernous, and supra clinoid segments are widely patent. A1 segments, anterior communicating artery, and anterior cerebral  arteries are widely patent. M1 segments are widely patent without occlusion or stenosis. MCA bifurcations normal. Distal MCA branches within normal limits.  POSTERIOR CIRCULATION:  Left vertebral artery is dominant and widely patent to the vertebrobasilar junction. The diminutive right vertebral artery divides at the takeoff of the right posterior inferior cerebral artery with a smaller hypoplastic branch ascending towards the vertebrobasilar junction. The posterior inferior cerebral arteries and cells are patent. Basilar artery is somewhat diminutive but patent. Superior cerebellar arteries well opacified. There appears to be fetal origin of the posterior cerebral arteries bilaterally with widely patent posterior communicating arteries.  No aneurysm.  IMPRESSION: MRI HEAD IMPRESSION:  Normal MRI with no acute intracranial infarct or other process identified.  MRA HEAD IMPRESSION:  1. No proximal branch occlusion or hemodynamically significant stenosis identified within the intracranial circulation. 2. Fetal origin of the PCAs bilaterally with somewhat diminutive vertebrobasilar system.   Electronically Signed   By: Rise MuBenjamin  McClintock M.D.   On: 08/05/2014 06:50   Mr Maxine GlennMra Head/brain Wo Cm  08/05/2014   CLINICAL DATA:  Initial evaluation for acute onset left upper and lower extremity numbness.  EXAM: MRI HEAD WITHOUT CONTRAST  MRA HEAD WITHOUT CONTRAST  TECHNIQUE: Multiplanar,  multiecho pulse sequences of the brain and surrounding structures were obtained without intravenous contrast. Angiographic images of the head were obtained using MRA technique without contrast.  COMPARISON:  Prior CT from 08/04/2014  FINDINGS: MRI HEAD FINDINGS  The CSF containing spaces are within normal limits for patient age. No focal parenchymal signal abnormality is identified. No mass lesion, midline shift, or extra-axial fluid collection. Ventricles are normal in size without evidence of hydrocephalus.  No diffusion-weighted signal  abnormality is identified to suggest acute intracranial infarct. Gray-white matter differentiation is maintained. Normal flow voids are seen within the intracranial vasculature. No intracranial hemorrhage identified.  The cervicomedullary junction is normal. Pituitary gland is within normal limits. Pituitary stalk is midline. The globes and optic nerves demonstrate a normal appearance with normal signal intensity.  The bone marrow signal intensity is normal. Calvarium is intact. Visualized upper cervical spine is within normal limits.  Scalp soft tissues are unremarkable.  Paranasal sinuses are clear.  No mastoid effusion.  MRA HEAD FINDINGS  ANTERIOR CIRCULATION:  Visualized distal portions of the cervical segments of the internal carotid arteries are widely patent with antegrade flow. The petrous, cavernous, and supra clinoid segments are widely patent. A1 segments, anterior communicating artery, and anterior cerebral arteries are widely patent. M1 segments are widely patent without occlusion or stenosis. MCA bifurcations normal. Distal MCA branches within normal limits.  POSTERIOR CIRCULATION:  Left vertebral artery is dominant and widely patent to the vertebrobasilar junction. The diminutive right vertebral artery divides at the takeoff of the right posterior inferior cerebral artery with a smaller hypoplastic branch ascending towards the vertebrobasilar junction. The posterior inferior cerebral arteries and cells are patent. Basilar artery is somewhat diminutive but patent. Superior cerebellar arteries well opacified. There appears to be fetal origin of the posterior cerebral arteries bilaterally with widely patent posterior communicating arteries.  No aneurysm.  IMPRESSION: MRI HEAD IMPRESSION:  Normal MRI with no acute intracranial infarct or other process identified.  MRA HEAD IMPRESSION:  1. No proximal branch occlusion or hemodynamically significant stenosis identified within the intracranial circulation.  2. Fetal origin of the PCAs bilaterally with somewhat diminutive vertebrobasilar system.   Electronically Signed   By: Rise Mu M.D.   On: 08/05/2014 06:50   Mm Screening Breast Tomo Bilateral  08/03/2014   CLINICAL DATA:  Screening.  EXAM: DIGITAL SCREENING BILATERAL MAMMOGRAM WITH 3D TOMO WITH CAD  COMPARISON:  Previous exam(s).  ACR Breast Density Category b: There are scattered areas of fibroglandular density.  FINDINGS: There are no findings suspicious for malignancy. Waxing and waning oval and round masses are again noted bilaterally, most compatible with cysts or other benign findings. Images were processed with CAD.  IMPRESSION: No mammographic evidence of malignancy. A result letter of this screening mammogram will be mailed directly to the patient.  RECOMMENDATION: Screening mammogram in one year. (Code:SM-B-01Y)  BI-RADS CATEGORY  1: Negative.   Electronically Signed   By: Annia Belt M.D.   On: 08/03/2014 13:29    Microbiology: No results found for this or any previous visit (from the past 240 hour(s)).   Labs: Basic Metabolic Panel:  Recent Labs Lab 08/04/14 1325 08/04/14 1332  NA 142 143  K 4.3 4.0  CL 106 105  CO2 28  --   GLUCOSE 110* 106*  BUN 13 12  CREATININE 0.82 0.80  CALCIUM 9.6  --    Liver Function Tests:  Recent Labs Lab 08/04/14 1325  AST 12*  ALT 14  ALKPHOS 69  BILITOT 0.4  PROT 7.2  ALBUMIN 3.5   No results for input(s): LIPASE, AMYLASE in the last 168 hours. No results for input(s): AMMONIA in the last 168 hours. CBC:  Recent Labs Lab 08/04/14 1325 08/04/14 1332  WBC 10.8*  --   NEUTROABS 6.0  --   HGB 11.2* 12.6  HCT 36.1 37.0  MCV 85.7  --   PLT 293  --    Cardiac Enzymes: No results for input(s): CKTOTAL, CKMB, CKMBINDEX, TROPONINI in the last 168 hours. BNP: BNP (last 3 results) No results for input(s): BNP in the last 8760 hours.  ProBNP (last 3 results) No results for input(s): PROBNP in the last 8760  hours.  CBG:  Recent Labs Lab 08/04/14 2147 08/05/14 0633 08/05/14 1154  GLUCAP 110* 133* 128*       Signed:  Aanika Defoor A  [facility] 08/05/2014, 2:43 PM

## 2014-08-05 NOTE — Progress Notes (Signed)
VASCULAR LAB PRELIMINARY  PRELIMINARY  PRELIMINARY  PRELIMINARY  Carotid duplex completed.    Preliminary report:  Bilateral:  1-39% ICA stenosis.  Vertebral artery flow is antegrade.     Shareena Nusz, RVs 08/05/2014, 12:01 PM

## 2014-08-05 NOTE — Care Management Note (Signed)
Case Management Note  Patient Details  Name: Helen [name] MRN: [id number] Date of Birth: 05/06/1965  Subjective/Objective:    ADMITTED WITH NUMBNESS, STROKE WORKUP                Action/Plan: TALKED TO PATIENT ABOUT HHC, CHOICES,PATIENT CHOSE [facility] FOR HHC SERVICES; [facility] CALLED FOR ARRANGEMENTS  Expected Discharge Date:  08/05/2014               Expected Discharge Plan:  Home w Home Health Services  In-House Referral:     Discharge planning Services  CM Consult Choice offered to:  Patient   [facility]  Status of Service:   IN PROGRESS   Additional Comments:  Reola MosherChandler, Yorel Redder L, RN,BSN,MHA [phone] 08/05/2014, 4:33 PM

## 2014-08-05 NOTE — Evaluation (Signed)
Occupational Therapy Evaluation Patient Details Name: Mosie EpsteinKaren L Garber MRN: [id number] DOB: [birth year] Today's Date: 08/05/2014    History of Present Illness 49 yo female admitted due to L side weakness. MRI (_) PMH CVA with L residual weakness, HTN Fibromyalgia gerd   Clinical Impression   Patient evaluated by Occupational Therapy with no further acute OT needs identified. All education has been completed and the patient has no further questions. See below for any follow-up Occupational Therapy or equipment needs. OT to sign off. Thank you for referral.      Follow Up Recommendations  No OT follow up    Equipment Recommendations  None recommended by OT    Recommendations for Other Services       Precautions / Restrictions Precautions Precautions: None      Mobility Bed Mobility Overal bed mobility: Modified Independent                Transfers Overall transfer level: Modified independent Equipment used: Rolling walker (2 wheeled)                  Balance                                            ADL Overall ADL's : At baseline                                       General ADL Comments: pt completed chair to toilet transfer , peri care, hand hygiene at sink and bed transfer. Pt feels she si little slower in movement but near baseline     Vision     Perception     Praxis      Pertinent Vitals/Pain Pain Assessment: No/denies pain     Hand Dominance Right   Extremity/Trunk Assessment Upper Extremity Assessment Upper Extremity Assessment: LUE deficits/detail LUE Deficits / Details: numbness from shoulder to finger tips but reports it has improved. Functionally during session WFL. Pt only describes sensation changes   Lower Extremity Assessment Lower Extremity Assessment: Defer to PT evaluation   Cervical / Trunk Assessment Cervical / Trunk Assessment: Normal   Communication  Communication Communication: No difficulties   Cognition Arousal/Alertness: Awake/alert Behavior During Therapy: WFL for tasks assessed/performed Overall Cognitive Status: Within Functional Limits for tasks assessed                     General Comments       Exercises       Shoulder Instructions      Home Living Family/patient expects to be discharged to:: Private residence Living Arrangements: Children Available Help at Discharge: Family;Friend(s);Available PRN/intermittently Type of Home: House             Bathroom Shower/Tub: Chief Strategy OfficerTub/shower unit   Bathroom Toilet: Standard         Additional Comments: has an Aide that come for an hour in the morning for ADLS  Lives With: Son;Daughter    Prior Functioning/Environment Level of Independence: Needs assistance    ADL's / Homemaking Assistance Needed: bathing / dressing        OT Diagnosis:     OT Problem List:     OT Treatment/Interventions:      OT Goals(Current goals can be found in the care plan  section)    OT Frequency:     Barriers to D/C:            Co-evaluation              End of Session Equipment Utilized During Treatment: Rolling walker Nurse Communication: Mobility status;Precautions  Activity Tolerance: Patient tolerated treatment well Patient left: in bed;with call bell/phone within reach   Time: [id number] OT Time Calculation (min): 14 min Charges:  OT General Charges $OT Visit: 1 Procedure OT Evaluation $Initial OT Evaluation Tier I: 1 Procedure G-Codes: OT G-codes **NOT FOR INPATIENT CLASS** Functional Assessment Tool Used: clinical judgement Functional Limitation: Self care Self Care Current Status (V4098(G8987): At least 1 percent but less than 20 percent impaired, limited or restricted Self Care Goal Status (J1914(G8988): At least 1 percent but less than 20 percent impaired, limited or restricted Self Care Discharge Status [phone](G8989): At least 1 percent but less than 20 percent  impaired, limited or restricted  Harolyn RutherfordJones, Josejuan Hoaglin B 08/05/2014, 2:50 PM Pager: [phone]

## 2014-08-06 LAB — HEMOGLOBIN A1C
Hgb A1c MFr Bld: 7.5 % — ABNORMAL HIGH (ref 4.8–5.6)
MEAN PLASMA GLUCOSE: 169 mg/dL

## 2014-09-02 ENCOUNTER — Ambulatory Visit (INDEPENDENT_AMBULATORY_CARE_PROVIDER_SITE_OTHER): Payer: Medicare Other | Admitting: Podiatry

## 2014-09-02 ENCOUNTER — Encounter: Payer: Self-pay | Admitting: Podiatry

## 2014-09-02 VITALS — BP 127/80 | HR 75 | Resp 18

## 2014-09-02 DIAGNOSIS — G629 Polyneuropathy, unspecified: Secondary | ICD-10-CM

## 2014-09-02 DIAGNOSIS — E1151 Type 2 diabetes mellitus with diabetic peripheral angiopathy without gangrene: Secondary | ICD-10-CM

## 2014-09-02 DIAGNOSIS — E1140 Type 2 diabetes mellitus with diabetic neuropathy, unspecified: Secondary | ICD-10-CM

## 2014-09-02 DIAGNOSIS — Q6650 Congenital pes planus, unspecified foot: Secondary | ICD-10-CM | POA: Diagnosis not present

## 2014-09-02 NOTE — Progress Notes (Signed)
Subjective:     Patient ID: Helen [name], female   DOB: Aug 13, 1965, 49 y.o.   MRN: [id number]  HPI patient presents stating she's been getting some tingling in her feet she wanted checked and some flatfoot deformity is noted and states that she has diabetes and is under reasonably good control but only recently   Review of Systems  All other systems reviewed and are negative.      Objective:   Physical Exam  Constitutional: She is oriented to person, place, and time.  Cardiovascular: Intact distal pulses.   Musculoskeletal: Normal range of motion.  Neurological: She is oriented to person, place, and time.  Skin: Skin is warm and dry.  Nursing note and vitals reviewed.  neurovascular status intact with mild diminishment of sharp Dole vibratory. Patient's noted to have moderate depression of the arch is noted to have discomfort that's non-diffuse in its nature plantar aspect of both feet that's not as much palpation as it is when she is sitting or after she's been on her feet for a long time     Assessment:     Probable moderate diabetic neuropathy bilateral with out significant symptoms    Plan:     Reviewed condition and discussed diabetic foot education. I've advised her to lose weight and also to do is good and she can to keep her sugar under good control reappoint to recheck

## 2014-09-02 NOTE — Progress Notes (Signed)
   Subjective:    Patient ID: Helen [name], female    DOB: 01/14/1966, 49 y.o.   MRN: [id number]  HPI I AM HERE TO GET MY FEET CHECKED AND I HAVE BEEN A DIABETIC FOR ABOUT A YEAR AND MY FEET BURN AND THROBS ON THE BOTTOM AND SORE AND TENDER AND THERE IS NO COLDNESS AND SWELLING AROUND MY FEET AND ANKLES AND THEY DO TINGLE SOME    Review of Systems  Constitutional: Positive for fatigue.  HENT: Positive for hearing loss.   Musculoskeletal:       MUSCLE PAIN AND SWELLING IN MY ANKLES  Hematological: Bruises/bleeds easily.  All other systems reviewed and are negative.      Objective:   Physical Exam        Assessment & Plan:

## 2014-11-05 ENCOUNTER — Encounter: Payer: Self-pay | Admitting: Neurology

## 2014-11-05 ENCOUNTER — Ambulatory Visit (INDEPENDENT_AMBULATORY_CARE_PROVIDER_SITE_OTHER): Payer: Medicare Other | Admitting: Neurology

## 2014-11-05 VITALS — BP 133/84 | HR 73 | Ht 64.0 in | Wt 266.8 lb

## 2014-11-05 DIAGNOSIS — E785 Hyperlipidemia, unspecified: Secondary | ICD-10-CM | POA: Diagnosis not present

## 2014-11-05 DIAGNOSIS — R200 Anesthesia of skin: Secondary | ICD-10-CM

## 2014-11-05 DIAGNOSIS — I10 Essential (primary) hypertension: Secondary | ICD-10-CM | POA: Insufficient documentation

## 2014-11-05 DIAGNOSIS — R208 Other disturbances of skin sensation: Secondary | ICD-10-CM | POA: Diagnosis not present

## 2014-11-05 MED ORDER — SIMVASTATIN 10 MG PO TABS: 10.0000 mg | ORAL_TABLET | Freq: Every day | ORAL | Status: DC

## 2014-11-05 NOTE — Progress Notes (Signed)
STROKE NEUROLOGY FOLLOW UP NOTE  NAME: Helen [name] DOB: June 21, 1965  REASON FOR VISIT: stroke follow up HISTORY FROM: pt and chart  Today we had the pleasure of seeing Helen [name] in follow-up at our Neurology Clinic. Pt was accompanied by no one.   History Summary [name] is an 49 y.o. female with a history of hypertension, diabetes mellitus, questionable stroke in 1997, fibromyalgia and degenerative disc disease was admitted on 08/04/14 in [facility] for worsening numbness involving left upper and lower extremities. Patient stated that she has residual numbness involving left side of her body since the questionable stroke in 20 years ago. At that time, she had TEE and was told to have small hole in the heart, but cerebral angio negative and she was told that her stroke was caused by her stress. She stated that since then, she still has intermittent left arm and leg numbness, had another TEE between 2006 and 2010 and was told the hole in the heart was gone. She was later diagnosed with fibromyalgia. From her series MRI and MRA for these years, they are all normal and there is no evidence of previous stroke. During the admission in 07/2014, MRI, MRA, CUS and 2D echo were all normal. LDL 123 and A1C 7.5, her symptoms felt to be associated with stress vs. Conversion disorder. She was discharged with ASA and zocor.   Interval History During the interval time, the patient has been doing well. Still complains of intermittent left arm and leg numbness, no facial numbness. Has a lot of stress at home as always. Follows with PCP regularly. BP today 133/84. She is out of zocor and will need refill.     REVIEW OF SYSTEMS: Full 14 system review of systems performed and notable only for those listed below and in HPI above, all others are negative:  Constitutional:  Chills, fatigue  Cardiovascular: Chest pain, leg swelling, murmur Ear/Nose/Throat:  Facial swelling, ear pain, ringing ears Skin:  Eyes:   Light sensitivity, double vision, eye pain, blurred vision Respiratory:  Wheezing, SOB Gastroitestinal:   Genitourinary:  Hematology/Lymphatic:  Anemia Endocrine: Cold intolerance Musculoskeletal:  Joint pain, joint swelling, back pain, aching muscles, muscle cramps, walking difficulty, neck pain, neck stiffness Allergy/Immunology:  Memory loss, dizziness, numbness, weakness, passing out Neurological:   Psychiatric: Nervous, anxious Sleep: Restless leg, frequent waking, snoring  The following represents the patient's updated allergies and side effects list: Allergies  Allergen Reactions  . Milnacipran     REACTION: palpitations  . Pregabalin Other (See Comments)    unknown  . Topiramate     REACTION: caused "inside seizures"  . Metronidazole Rash    The neurologically relevant items on the patient's problem list were reviewed on today's visit.  Neurologic Examination  A problem focused neurological exam (12 or more points of the single system neurologic examination, vital signs counts as 1 point, cranial nerves count for 8 points) was performed.  Blood pressure 133/84, pulse 73, height 5\' 4"  (1.626 m), weight 266 lb 12.8 oz (121.02 kg).  General - Morbid obesity, well developed, in no apparent distress.  Ophthalmologic - Fundi not visualized due to small pupils.  Cardiovascular - Regular rate and rhythm with no murmur.  Mental Status -  Level of arousal and orientation to time, place, and person were intact. Language including expression, naming, repetition, comprehension was assessed and found intact. Fund of Knowledge was assessed and was intact.  Cranial Nerves II - XII - II -  Visual field intact OU. III, IV, VI - Extraocular movements intact. V - Facial sensation intact bilaterally. VII - Facial movement intact bilaterally. VIII - Hearing & vestibular intact bilaterally. X - Palate elevates symmetrically. XI - Chin turning & shoulder shrug intact  bilaterally. XII - Tongue protrusion intact.  Motor Strength - The patient's strength was normal in all extremities and pronator drift was absent.  Bulk was normal and fasciculations were absent.   Motor Tone - Muscle tone was assessed at the neck and appendages and was normal.  Reflexes - The patient's reflexes were 2+ in all extremities and she had no pathological reflexes, no ankle clonus.  Sensory - Light touch, temperature/pinprick were assessed and were decreased on the left.    Coordination - The patient had normal movements in the hands and feet with no ataxia or dysmetria.  Tremor was absent.  Gait and Station - slow broad based gait due to morbid obesity.  Data reviewed: I personally reviewed the images and agree with the radiology interpretations.  Ct Head Wo Contrast 08/04/2014 Study within normal limits. No intracranial mass, hemorrhage, or focal gray - white compartment lesions/acute appearing infarct.   MRI HEAD  08/05/2014 Normal MRI with no acute intracranial infarct or other process identified.   MRA HEAD  08/05/2014 1. No proximal branch occlusion or hemodynamically significant stenosis identified within the intracranial circulation. 2. Fetal origin of the PCAs bilaterally with somewhat diminutive vertebrobasilar system.   Carotid Doppler There is 1-39% bilateral ICA stenosis. Vertebral artery flow is antegrade.   2D echo - - Left ventricle: The cavity size was normal. Systolic function was normal. The estimated ejection fraction was in the range of 55% to 60%. Wall motion was normal; there were no regional wall motion abnormalities. - Atrial septum: No defect or patent foramen ovale was identified.  Component     Latest Ref Rng 08/05/2014  Cholesterol     0 - 200 mg/dL 409  Triglycerides     <150 mg/dL 811  HDL Cholesterol     >40 mg/dL 43  Total CHOL/HDL Ratio      4.3  VLDL     0 - 40 mg/dL 21  LDL (calc)     0 - 99 mg/dL 914 (H)   Hemoglobin N8G     4.8 - 5.6 % 7.5 (H)  Mean Plasma Glucose      169    Assessment: As you may recall, she is a 49 y.o. African American female with PMH of HTN, DM, questionable stroke in 1997 with residue intermittent left sided numbness, fibromyalgia, neck and back pain was admitted 08/04/14 for worsening left sided numbness. MRI and MRA normal. Her previous stroke history was questionable (see above) and series MRIs did not reveal chronic infarct. She likely to have fibromyalgia vs. conversion disorder due to stress or anxiety. Stroke etiology less likely. However, she does have several stroke risk factors, such as HTN, DM and morbid obesity. Will continue ASA and zocor for stroke prevention.  Plan:  - continue ASA and zocor for stroke prevention - check BP and glucose at home - Follow up with your primary care physician for stroke risk factor modification. Recommend maintain blood pressure goal <130/80, diabetes with hemoglobin A1c goal below 6.5% and lipids with LDL cholesterol goal below 70 mg/dL.  - healthy diet and regular exercise and lose weight - RTC PRN.  No orders of the defined types were placed in this encounter.    Meds ordered  this encounter  Medications  . metFORMIN (GLUCOPHAGE) 1000 MG tablet    Sig:   . omeprazole (PRILOSEC) 20 MG capsule    Sig: Take 1 capsule by mouth once daily before a meal    Refill:  2  . DISCONTD: predniSONE (STERAPRED UNI-PAK 21 TAB) 10 MG (21) TBPK tablet    Sig: Follow direction on dose pack    Refill:  0  . simvastatin (ZOCOR) 10 MG tablet    Sig: Take 1 tablet (10 mg total) by mouth daily at 6 PM.    Dispense:  90 tablet    Refill:  3    Patient Instructions  - continue ASA and zocor for stroke prevention - check BP and glucose at home - Follow up with your primary care physician for stroke risk factor modification. Recommend maintain blood pressure goal <130/80, diabetes with hemoglobin A1c goal below 6.5% and lipids with LDL  cholesterol goal below 70 mg/dL.  - healthy diet and regular exercise, gradually increase activity - follow up as needed.   Marvel Plan, MD PhD [facility] [address], [address] [place], [place] [address] [phone]

## 2014-11-05 NOTE — Patient Instructions (Signed)
-   continue ASA and zocor for stroke prevention - check BP and glucose at home - Follow up with your primary care physician for stroke risk factor modification. Recommend maintain blood pressure goal <130/80, diabetes with hemoglobin A1c goal below 6.5% and lipids with LDL cholesterol goal below 70 mg/dL.  - healthy diet and regular exercise, gradually increase activity - follow up as needed.

## 2014-11-30 ENCOUNTER — Ambulatory Visit: Payer: Medicare Other | Admitting: Neurology

## 2014-12-10 ENCOUNTER — Encounter: Payer: Self-pay | Admitting: Physical Therapy

## 2014-12-10 ENCOUNTER — Ambulatory Visit: Payer: Medicare Other | Attending: Internal Medicine | Admitting: Physical Therapy

## 2014-12-10 DIAGNOSIS — R262 Difficulty in walking, not elsewhere classified: Secondary | ICD-10-CM | POA: Insufficient documentation

## 2014-12-10 DIAGNOSIS — G894 Chronic pain syndrome: Secondary | ICD-10-CM | POA: Insufficient documentation

## 2014-12-10 DIAGNOSIS — G609 Hereditary and idiopathic neuropathy, unspecified: Secondary | ICD-10-CM | POA: Insufficient documentation

## 2014-12-10 DIAGNOSIS — M545 Low back pain: Secondary | ICD-10-CM | POA: Diagnosis not present

## 2014-12-10 NOTE — Therapy (Signed)
[facility] [address] [place], [place], [address] Phone: [phone]   Fax:  [phone]  Physical Therapy Evaluation  Patient Details  Name: Helen [name] MRN: [id number] Date of Birth: 02-10-1966 Referring Provider:  Gwenyth Bender, MD  Encounter Date: 12/10/2014      PT End of Session - 12/10/14 1544    Visit Number 1   Number of Visits 16   Date for PT Re-Evaluation 02/18/15   Authorization Type G-code every 10th visit/secondary is medicaid   PT Start Time 1315   PT Stop Time 1400   PT Time Calculation (min) 45 min   Activity Tolerance Patient tolerated treatment well;Other (comment)  pain improved from 7/10 to 3/10   Behavior During Therapy [facility] for tasks assessed/performed      Past Medical History  Diagnosis Date  . Fibromyalgia   . Acid reflux   . Scoliosis   . Heart murmur   . Anxiety   . Chronic headaches   . Anemia   . Stroke 1997  . Hypertension   . Degenerative disc disease     lumbar    Past Surgical History  Procedure Laterality Date  . Abdominal hysterectomy    . Cesarean section    . Laparoscopic unilateral salpingo oopherectomy Right 2005    There were no vitals filed for this visit.  Visit Diagnosis:  Midline low back pain, with sciatica presence unspecified  Chronic pain syndrome  Difficulty walking  Hereditary and idiopathic peripheral neuropathy      Subjective Assessment - 12/10/14 1521    Pain Score 5   Pain Location Other (Comment)  with fibromyalgia ; has pain all over; difficult to localize   Pain Descriptors / Indicators Aching;Burning;Constant;Radiating;Heaviness;Shooting   Pain Type Chronic pain   Pain Onset More than a month ago   Pain Frequency Constant   Aggravating Factors  unknown   Pain Relieving Factors just deals with pain   Effect of Pain on Daily Activities limits all actitvies; varies depending on intensity             [facility] PT  Assessment - 12/10/14 0001    Assessment   Medical Diagnosis lumbago with radiculopathy; fibromyalgia   Precautions   Precautions Fall   Precaution Comments did not have cane today   Restrictions   Weight Bearing Restrictions No   Balance Screen   Has the patient fallen in the past 6 months Yes   How many times? 4-8   Has the patient had a decrease in activity level because of a fear of falling?  Yes   Is the patient reluctant to leave their home because of a fear of falling?  Yes   Home Environment   Living Environment Private residence   Home Access Stairs to enter   Entrance Stairs-Number of Steps 4   Prior Function   Level of Independence Independent with basic ADLs   Cognition   Overall Cognitive Status Within Functional Limits for tasks assessed   Awareness Appears intact   Problem Solving Appears intact   Observation/Other Assessments   Skin Integrity wnl   Sensation   Light Touch Appears Intact   Coordination   Gross Motor Movements are Fluid and Coordinated Yes   Posture/Postural Control   Posture/Postural Control No significant limitations   ROM / Strength   AROM / PROM / Strength Strength   Strength   Overall Strength Within functional limits for tasks performed   Strength  Assessment Site Shoulder;Hip;Knee   Right/Left Shoulder Right;Left   Right Shoulder Flexion 5/5   Right Shoulder ABduction 5/5   Left Shoulder Flexion 5/5   Left Shoulder ABduction 5/5   Right/Left Hip Right;Left   Right Hip Flexion 5/5   Left Hip Extension 5/5   Right/Left Knee Right;Left   Right Knee Flexion 5/5   Right Knee Extension 5/5   Left Knee Flexion 5/5   Left Knee Extension 5/5   Palpation   Spinal mobility AROM WFL   Transfers   Transfers Stand to Sit;Sit to Supine   Stand to Sit 7: Independent   Sit to Supine 5: Supervision   Sit to Supine Details  --  some dizziness reported; pain with lying fully supine   Ambulation/Gait   Ambulation Distance (Feet) 115 Feet    Assistive device None   Gait Pattern Wide base of support   Gait velocity 2.5 ft/sec      MODCTSIB  3/4  30, 15, 30 , 30 TUG  25 seconds  Amb 115' in 46 seconds; no A device MMT; bilateral gross assessment of UE and LE symmetrical and 5/5  Standing 5/10 pain in central low back ; runs across hips Flexion in standing; slight increase in pain on left lower side Repeat flexion in standing x 6;  Worsened pain  Extension  In standing ; just a little tension in hips Repeat extension in standing x 10 ; not as bad with back but started to get tight  Lying supine; back pain 10/10 feels like something it pushing in back; improved pain in hooklying Flexion in lying ( knees to chest) ; no change Flexion in lying  ; made pain worse Extension in lying;  A little better Repeated extension in lying ; prone press up x 10  Relieved back pain  No radiating pain down legs;  Only a 3/10                      PT Education - 12/10/14 1542    Education provided Yes   Education Details pt to do 5 -10 reps of press ups ; pain free motion; 2-3 x day; instructed to stop if she had any radiating pain   Person(s) Educated Patient   Methods Explanation   Comprehension Returned demonstration          PT Short Term Goals - 12/10/14 1604    PT SHORT TERM GOAL #1   Title Able to centralize lower back pain and decrease intensity and frequency of lower back pain so she can stand for 10 minutes    Baseline Standing limited to <5 min due to lower back pain   Time 4   Period Weeks   Status New   PT SHORT TERM GOAL #2   Title Able to walk 500 feet without increase lower back pain or feeling of instablity in legs   Baseline Walking >100 feet results in sensation of legs giving way and back starts to ache and radiate pain   Time 4   Period Weeks   Status New   PT SHORT TERM GOAL #3   Title Patient will be independent in basic home exercise program for lower back and leg flexibility   Baseline  no regular home program   Time 4   Period Weeks   Status New           PT Long Term Goals - 12/10/14 1608    PT LONG  TERM GOAL #1   Title Patients lower back  and leg flexiblity, pain and fatigue will improve so she can stand for >78min so she can effectively engage in household actiivtes.   Baseline LImited to < 5 min   Time 8   Period Weeks   Status New   PT LONG TERM GOAL #2   Title Patiens lower back  and leg flexibility , pain and fatige will improve so she can walk community distances, at least 1500 feet so she can engage in recreational and self care actiivtes (shopping) w/LRAD   Baseline walking <100' with cane with wide base of support with pain exacerbating to severe levels   Time 8   Period Weeks   Status New   PT LONG TERM GOAL #3   Title Patient will be independent in a comprehensive home exercise program with several techniques to address the chronic pain/fibromyaligia ; evident by decreased frequency and intensity of pain.   Baseline unable to localize her pain; reporting pain 24/7 at mod to severe levels   Time 8   Period Weeks               Plan - 12-12-2014 1550    Clinical Impression Statement Patient presents with multiple co-morbids and reporting severe pain; she stated her MD is setting her up with additonal assements. She was very cooperative during the eval and was able to perform the balance, gait and lower back assesment.  I focused on her lower back pain with asseement on AROM  and peripherailizaton vs centralization of symptoms.  She worsened with all mvts in standing and  lying down except for back extension in prone-prone press ups; after wards she improved from 10/10 pain in lying supine to  2-3/10 after 10 press ups.  She was able to change her pain increase and decreaese with the differrent positions indiicating a favorable prognosis and therapeutic exericse should result in improved function and less pain.  With hx of fibromyalgia it's important  that she has some 'tools' she can use to help improve her symptoms ; we can develop this through therapy sessions and help her have more control over her pain ; she also seems willing and able to understand the disease better and should be able to imrove her pain hypersenistiivty.    Pt will benefit from skilled therapeutic intervention in order to improve on the following deficits Decreased range of motion;Difficulty walking;Decreased activity tolerance;Pain;Improper body mechanics;Decreased balance   Rehab Potential Good   PT Frequency 2x / week   PT Duration 8 weeks   PT Treatment/Interventions Therapeutic exercise;Therapeutic activities;Functional mobility training;Stair training;Gait training;Balance training;Neuromuscular re-education;Patient/family education;Manual techniques;Energy conservation;Passive range of motion   PT Next Visit Plan follow up with effetivness of prone press up ; add illopsoas stretch and hamstring stretch to HEP   PT Home Exercise Plan back and LE stretching and AROM ex's   Consulted and Agree with Plan of Care Patient          G-Codes - 2014-12-12 1616    Functional Assessment Tool Used TUG; pain scale   Functional Limitation Mobility: Walking and moving around   Mobility: Walking and Moving Around Current Status (Z6109) At least 60 percent but less than 80 percent impaired, limited or restricted   Mobility: Walking and Moving Around Goal Status [phone]) At least 1 percent but less than 20 percent impaired, limited or restricted       Problem List Patient Active Problem List   Diagnosis  Date Noted  . HLD (hyperlipidemia) 11/05/2014  . Essential hypertension 11/05/2014  . Numbness 08/04/2014  . H/O fibromyalgia 08/04/2014  . Left upper extremity numbness   . Numbness of left lower extremity   . UTI 07/01/2008  . DIARRHEA, ACUTE 06/11/2008  . GERD 05/28/2008  . PERSISTENT DISORDER INITIATING/MAINTAINING SLEEP 04/01/2008  . STROKE 04/01/2008  . HEADACHE,  CHRONIC 04/01/2008  . Morbid obesity 03/16/2008  . BACK PAIN 12/17/2007  . Sleep apnea 12/17/2007  . 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 FIBROMYALGIA 12/03/2007    Vashti Hey D PT DPT 12/10/2014, 4:23 PM  [facility] [address] [place], [place], [address] Phone: [phone]   Fax:  [phone]

## 2014-12-10 NOTE — Patient Instructions (Signed)
Press-Up   Press upper body upward, keeping hips in contact with floor. Keep lower back and buttocks relaxed. Hold ____ seconds. Repeat ____ times per set. Do ____ sets per session. Do ____ sessions per day.  http://orth.exer.us/94   Copyright  VHI. All rights reserved.

## 2014-12-16 ENCOUNTER — Ambulatory Visit: Payer: Medicare Other | Admitting: Physical Therapy

## 2014-12-16 DIAGNOSIS — M545 Low back pain: Secondary | ICD-10-CM

## 2014-12-16 NOTE — Patient Instructions (Signed)
(  Clinic) Flexion: Pelvic Tilt   Lie with neck supported, knees bent, feet flat. Tighten and suck stomach in, pushing back down against surface. Do not push down with legs. Repeat 10 times per set. Do 2 sets per session. Do 2 times a day.  Copyright  VHI. All rights reserved.   Low Back Stretch: One leg (Supine)   Lying on back, bring one knee toward chest by pulling gently behind knee. Hold 20 seconds. Repeat with other leg.  Repeat 2 times on leg.  Copyright  VHI. All rights reserved.   Low Back Stretch: Two Leg (Supine)   Lying on back, bring knees toward chest by pulling gently behind knees. Hold 20 seconds.  Repeat 2 times.  Copyright  VHI. All rights reserved.   Knee Roll   Lying on back, with knees bent and feet flat on floor, arms outstretched to sides, slowly roll both knees to side, hold 5 seconds. Back to starting position, hold 5 seconds. Then to opposite side, hold 5 seconds. Return to starting position. Keep shoulders and arms in contact with floor.  Repeat 4 times to each side.   Copyright  VHI. All rights reserved.  Hip Flexor Stretch   Lying on back near edge of bed, bend one leg, foot flat. Hang other leg over edge, relaxed, thigh resting entirely on bed for 60 seconds.  Repeat 2 times. Repeat on other leg.  Do 2 sessions per day. Advanced Exercise: Bend knee back keeping thigh in contact with bed.  http://gt2.exer.us/347   Copyright  VHI. All rights reserved.   Leg Extension (Hamstring)   Sit toward front edge of chair, with leg out straight, heel on floor, toes pointing toward body. Keeping back straight, bend forward at hip.  Hold this stretch for 60 seconds.  Repeat 3 times.  Repeat with other leg.  Do 2 times a day.  Copyright  VHI. All rights reserved.

## 2014-12-16 NOTE — Therapy (Signed)
[facility] [address] [place], [place], [address] Phone: [phone]   Fax:  [phone]  Physical Therapy Treatment  Patient Details  Name: Helen [name] MRN: [id number] Date of Birth: 09/11/1965 Referring Provider:  Gwenyth Bender, MD  Encounter Date: 12/16/2014      PT End of Session - 12/16/14 0857    Visit Number 2   Number of Visits 16   Date for PT Re-Evaluation 02/18/15   Authorization Type G-code every 10th visit/secondary is medicaid   PT Start Time 0806   PT Stop Time 0847   PT Time Calculation (min) 41 min   Activity Tolerance Patient tolerated treatment well   Behavior During Therapy [facility] for tasks assessed/performed      Past Medical History  Diagnosis Date  . Fibromyalgia   . Acid reflux   . Scoliosis   . Heart murmur   . Anxiety   . Chronic headaches   . Anemia   . Stroke 1997  . Hypertension   . Degenerative disc disease     lumbar    Past Surgical History  Procedure Laterality Date  . Abdominal hysterectomy    . Cesarean section    . Laparoscopic unilateral salpingo oopherectomy Right 2005    There were no vitals filed for this visit.  Visit Diagnosis:  Midline low back pain, with sciatica presence unspecified      Subjective Assessment - 12/16/14 0810    Subjective Went to neurologist who did nerve conduction and is sending for MRI.   Pertinent History possible cva 20 years ago; fibromyalgia; DM;    Limitations Standing;Sitting   How long can you stand comfortably? after about 5 minutes she starts getting tightness in lower back and legs lose function with numbness increasing; also starts getting a lot of pain in shoulders and neck even with just sitting   How long can you walk comfortably? with walking in a store ; she has to have a cart ; even with that she has sensation of back feeling like it will break   Patient Stated Goals I want to be more mobile, able to stand longer  period of time w/o legs and back hurting and not giving out   Currently in Pain? Yes   Pain Score 6    Pain Location Back   Pain Orientation Lower   Pain Descriptors / Indicators Aching;Heaviness   Pain Type Chronic pain   Pain Onset More than a month ago   Pain Frequency Constant   Aggravating Factors  walking and long walking   Pain Relieving Factors lying down   Multiple Pain Sites Yes   Pain Score 9   Pain Location Other (Comment)  fibromyalgia   Pain Descriptors / Indicators Aching;Burning;Constant;Heaviness;Shooting   Pain Type Chronic pain   Pain Onset More than a month ago   Pain Frequency Constant   Aggravating Factors  unknown and weather   Pain Relieving Factors deals with it       Treatment consisted of performing HEP as written.  See HEP for details.         PT Education - 12/16/14 0855    Education provided Yes   Education Details HEP, Silver Sneakers, Water Aerobics (heated pool) for decreased impact on joints   Person(s) Educated Patient   Methods Explanation;Demonstration;Handout   Comprehension Verbalized understanding          PT Short Term Goals - 12/10/14 1604    PT  SHORT TERM GOAL #1   Title Able to centralize lower back pain and decrease intensity and frequency of lower back pain so she can stand for 10 minutes    Baseline Standing limited to <5 min due to lower back pain   Time 4   Period Weeks   Status New   PT SHORT TERM GOAL #2   Title Able to walk 500 feet without increase lower back pain or feeling of instablity in legs   Baseline Walking >100 feet results in sensation of legs giving way and back starts to ache and radiate pain   Time 4   Period Weeks   Status New   PT SHORT TERM GOAL #3   Title Patient will be independent in basic home exercise program for lower back and leg flexibility   Baseline no regular home program   Time 4   Period Weeks   Status New           PT Long Term Goals - 12/10/14 1608    PT LONG TERM  GOAL #1   Title Patients lower back  and leg flexiblity, pain and fatigue will improve so she can stand for >57min so she can effectively engage in household actiivtes.   Baseline LImited to < 5 min   Time 8   Period Weeks   Status New   PT LONG TERM GOAL #2   Title Patiens lower back  and leg flexibility , pain and fatige will improve so she can walk community distances, at least 1500 feet so she can engage in recreational and self care actiivtes (shopping) w/LRAD   Baseline walking <100' with cane with wide base of support with pain exacerbating to severe levels   Time 8   Period Weeks   Status New   PT LONG TERM GOAL #3   Title Patient will be independent in a comprehensive home exercise program with several techniques to address the chronic pain/fibromyaligia ; evident by decreased frequency and intensity of pain.   Baseline unable to localize her pain; reporting pain 24/7 at mod to severe levels   Time 8   Period Weeks               Plan - 12/16/14 4132    Clinical Impression Statement Pt tolerated HEP well today and encouraged pt to perform through out the day (vs all at once).  Continue PT per POC.   Pt will benefit from skilled therapeutic intervention in order to improve on the following deficits Decreased range of motion;Difficulty walking;Decreased activity tolerance;Pain;Improper body mechanics;Decreased balance   Rehab Potential Good   PT Frequency 2x / week   PT Duration 8 weeks   PT Treatment/Interventions Therapeutic exercise;Therapeutic activities;Functional mobility training;Stair training;Gait training;Balance training;Neuromuscular re-education;Patient/family education;Manual techniques;Energy conservation;Passive range of motion   PT Next Visit Plan Review HEP and add/revise as needed.  Add strengthening exercises.   PT Home Exercise Plan back and LE stretching and AROM ex's   Consulted and Agree with Plan of Care Patient        Problem List Patient  Active Problem List   Diagnosis Date Noted  . HLD (hyperlipidemia) 11/05/2014  . Essential hypertension 11/05/2014  . Numbness 08/04/2014  . H/O fibromyalgia 08/04/2014  . Left upper extremity numbness   . Numbness of left lower extremity   . UTI 07/01/2008  . DIARRHEA, ACUTE 06/11/2008  . GERD 05/28/2008  . PERSISTENT DISORDER INITIATING/MAINTAINING SLEEP 04/01/2008  . STROKE 04/01/2008  . HEADACHE, CHRONIC  04/01/2008  . Morbid obesity 03/16/2008  . BACK PAIN 12/17/2007  . Sleep apnea 12/17/2007  . 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 FIBROMYALGIA 12/03/2007    Newell Coral 12/16/2014, 8:59 AM  [facility] [address] [place], [place], [address] Phone: [phone]   Fax:  [phone]     Newell Coral, [place] [facility] 12/16/2014 8:59 AM Phone: [phone] Fax: [phone]

## 2014-12-17 ENCOUNTER — Ambulatory Visit: Payer: Medicare Other | Admitting: Physical Therapy

## 2014-12-22 ENCOUNTER — Other Ambulatory Visit: Payer: Self-pay | Admitting: Specialist

## 2014-12-22 ENCOUNTER — Ambulatory Visit: Payer: Medicare Other | Admitting: Physical Therapy

## 2014-12-22 DIAGNOSIS — R55 Syncope and collapse: Secondary | ICD-10-CM

## 2014-12-23 ENCOUNTER — Ambulatory Visit: Payer: Medicare Other | Admitting: Physical Therapy

## 2014-12-23 DIAGNOSIS — M545 Low back pain: Secondary | ICD-10-CM

## 2014-12-23 NOTE — Therapy (Signed)
[facility] [address] [place], [place], [address] Phone: [phone]   Fax:  [phone]  Physical Therapy Treatment  Patient Details  Name: Helen [name] MRN: [id number] Date of Birth: November 07, 1965 Referring Provider:  Gwenyth Bender, MD  Encounter Date: 12/23/2014      PT End of Session - 12/23/14 1354    Visit Number 3   Number of Visits 16   Date for PT Re-Evaluation 02/18/15   Authorization Type G-code every 10th visit/secondary is [employer] auth-no qualifying diagnosis to submit for Medicaid   PT Start Time 0809  Pt arrived late   PT Stop Time 0848   PT Time Calculation (min) 39 min   Activity Tolerance Patient tolerated treatment well   Behavior During Therapy [facility] for tasks assessed/performed      Past Medical History  Diagnosis Date  . Fibromyalgia   . Acid reflux   . Scoliosis   . Heart murmur   . Anxiety   . Chronic headaches   . Anemia   . Stroke 1997  . Hypertension   . Degenerative disc disease     lumbar    Past Surgical History  Procedure Laterality Date  . Abdominal hysterectomy    . Cesarean section    . Laparoscopic unilateral salpingo oopherectomy Right 2005    There were no vitals filed for this visit.  Visit Diagnosis:  Midline low back pain, with sciatica presence unspecified      Subjective Assessment - 12/23/14 0811    Subjective Pt reports she hasn't been able to do many of the exercises due to packing/moving preparations; EEG yesterday; MRI next Saturday.  Reports she may have pinched nerve in back; they may want to do injections   Currently in Pain? Yes   Pain Score 3    Pain Location Back  shooting pain down legs   Pain Orientation Lower  both legs   Pain Descriptors / Indicators Heaviness;Aching;Shooting   Pain Type Chronic pain   Pain Radiating Towards mid-low back, down through legs   Pain Frequency Constant   Aggravating Factors  walking   Pain Relieving Factors lying down, resting/rubbing                         [facility] Adult PT Treatment/Exercise - 12/23/14 0818    Exercises   Exercises Lumbar;Knee/Hip;Shoulder  Review of HEP-pt return demo understanding   Lumbar Exercises: Stretches   Active Hamstring Stretch 2 reps;30 seconds  seated   Single Knee to Chest Stretch 3 reps;30 seconds   Double Knee to Chest Stretch 3 reps;Other (comment)  15 seconds   Lower Trunk Rotation 3 reps;30 seconds   Hip Flexor Stretch 1 rep;60 seconds   Pelvic Tilt Other (comment)  10 reps   Lumbar Exercises: Seated   Other Seated Lumbar Exercises pelvic tilts sitting 10 reps  Added to HEP   Shoulder Exercises: Seated   Elevation AROM;Both;10 reps   Retraction AROM;Right;Left;10 reps   Other Seated Exercises Shoulder circles 10 reps bilateral          Therapeutic Activity: -Bed mobility education for posture/positioning-with supine>sit, pt goes to long sit position, then brings leg off mat; instructed patient in and pt performs log roll technique for improved back positioning with bed mobility. -Demonstrated use of pillows between knees in sidelying and under knees in supine for improved positioning for low back while sleeping.      PT  Education - 12/23/14 1353    Education provided Yes   Education Details positioning with bed mobility, bed mobility technique   Person(s) Educated Patient   Methods Explanation;Demonstration;Handout   Comprehension Verbalized understanding;Returned demonstration          PT Short Term Goals - 12/10/14 1604    PT SHORT TERM GOAL #1   Title Able to centralize lower back pain and decrease intensity and frequency of lower back pain so she can stand for 10 minutes    Baseline Standing limited to <5 min due to lower back pain   Time 4   Period Weeks   Status New   PT SHORT TERM GOAL #2   Title Able to walk 500 feet without increase lower back pain or feeling of instablity in legs    Baseline Walking >100 feet results in sensation of legs giving way and back starts to ache and radiate pain   Time 4   Period Weeks   Status New   PT SHORT TERM GOAL #3   Title Patient will be independent in basic home exercise program for lower back and leg flexibility   Baseline no regular home program   Time 4   Period Weeks   Status New           PT Long Term Goals - 12/10/14 1608    PT LONG TERM GOAL #1   Title Patients lower back  and leg flexiblity, pain and fatigue will improve so she can stand for >29min so she can effectively engage in household actiivtes.   Baseline LImited to < 5 min   Time 8   Period Weeks   Status New   PT LONG TERM GOAL #2   Title Patiens lower back  and leg flexibility , pain and fatige will improve so she can walk community distances, at least 1500 feet so she can engage in recreational and self care actiivtes (shopping) w/LRAD   Baseline walking <100' with cane with wide base of support with pain exacerbating to severe levels   Time 8   Period Weeks   Status New   PT LONG TERM GOAL #3   Title Patient will be independent in a comprehensive home exercise program with several techniques to address the chronic pain/fibromyaligia ; evident by decreased frequency and intensity of pain.   Baseline unable to localize her pain; reporting pain 24/7 at mod to severe levels   Time 8   Period Weeks               Plan - 12/23/14 1401    Clinical Impression Statement Pt does not appear to be performing exercises at home, but she does report improvement in symptoms with exercises during therapy session.  Pt will continue to benefit from further skilled PT to address strength, balance, gait.   Pt will benefit from skilled therapeutic intervention in order to improve on the following deficits Decreased range of motion;Difficulty walking;Decreased activity tolerance;Pain;Improper body mechanics;Decreased balance   Rehab Potential Good   PT Frequency  2x / week   PT Duration 8 weeks   PT Treatment/Interventions Therapeutic exercise;Therapeutic activities;Functional mobility training;Stair training;Gait training;Balance training;Neuromuscular re-education;Patient/family education;Manual techniques;Energy conservation;Passive range of motion   PT Next Visit Plan Review seated pelvic tilts.  Add strengthening exercises.   PT Home Exercise Plan back and LE stretching and AROM ex's   Consulted and Agree with Plan of Care Patient        Problem List Patient Active Problem  List   Diagnosis Date Noted  . HLD (hyperlipidemia) 11/05/2014  . Essential hypertension 11/05/2014  . Numbness 08/04/2014  . H/O fibromyalgia 08/04/2014  . Left upper extremity numbness   . Numbness of left lower extremity   . UTI 07/01/2008  . DIARRHEA, ACUTE 06/11/2008  . GERD 05/28/2008  . PERSISTENT DISORDER INITIATING/MAINTAINING SLEEP 04/01/2008  . STROKE 04/01/2008  . HEADACHE, CHRONIC 04/01/2008  . Morbid obesity 03/16/2008  . BACK PAIN 12/17/2007  . Sleep apnea 12/17/2007  . 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 FIBROMYALGIA 12/03/2007    MARRIOTT,AMY W. 12/23/2014, 2:06 PM  [name]., PT  [facility] [address] [place], [place], [address] Phone: [phone]   Fax:  [phone]

## 2014-12-23 NOTE — Patient Instructions (Signed)
Sleeping on Back   Place pillow under knees. A pillow with cervical support and a roll around waist are also helpful.   Copyright  VHI. All rights reserved.  Sleeping on Side   Place pillow between knees. Use cervical support under neck and a roll around waist as needed.   Copyright  VHI. All rights reserved.  Getting Into / Out of Bed   Lower self to lie down on one side by raising legs and lowering head at the same time. Use arms to assist moving without twisting. Bend both knees to roll onto back if desired. To sit up, start from lying on side, and use same move-ments in reverse. Keep trunk aligned with legs.   Copyright  VHI. All rights reserved.

## 2014-12-24 ENCOUNTER — Ambulatory Visit: Payer: Medicare Other | Admitting: Physical Therapy

## 2014-12-24 DIAGNOSIS — M545 Low back pain: Secondary | ICD-10-CM

## 2014-12-26 NOTE — Therapy (Signed)
[facility] [address] [place], [place], [address] Phone: [phone]   Fax:  [phone]  Patient Details  Name: Helen [name] MRN: [id number] Date of Birth: 01-Feb-1966 Referring Provider:  Gwenyth Bender, MD  Encounter Date: 12/24/2014  PT received email from insurance coordinator after previous visit, indicating that Medicaid was not submitted due to no qualifying Medicaid diagnosis.  Had discussion with patient today regarding insurance in order to make patient aware (since this therapist was not evaluating therapist).  Patient opts to not be seen for treatment today and cancel next week's appointments due to financial concerns.  PT provided patient with name and number of [name], Contractor.   [name] W. 12/26/2014, 2:45 PM  [name]., PT  [facility] [address] [place], [place], [address] Phone: [phone]   Fax:  [phone]

## 2014-12-28 ENCOUNTER — Ambulatory Visit: Payer: Medicare Other | Admitting: Physical Therapy

## 2014-12-31 ENCOUNTER — Ambulatory Visit: Payer: Medicare Other | Admitting: Physical Therapy

## 2015-01-01 ENCOUNTER — Ambulatory Visit
Admission: RE | Admit: 2015-01-01 | Discharge: 2015-01-01 | Disposition: A | Discharge status: Home / Self Care | Payer: Medicare Other | Source: Ambulatory Visit | Attending: Specialist | Admitting: Specialist

## 2015-01-01 DIAGNOSIS — R55 Syncope and collapse: Secondary | ICD-10-CM

## 2015-01-04 ENCOUNTER — Ambulatory Visit: Payer: Medicare Other | Admitting: Physical Therapy

## 2015-01-07 ENCOUNTER — Ambulatory Visit: Payer: Medicare Other | Admitting: Physical Therapy

## 2015-04-14 ENCOUNTER — Encounter: Payer: Self-pay | Admitting: Physical Therapy

## 2015-04-14 NOTE — Therapy (Signed)
[facility] [address] [place], [place], [address] Phone: [phone]   Fax:  [phone]  Patient Details  Name: Helen [name] MRN: [id number] Date of Birth: [birth year] Referring Provider:  No ref. provider found  Encounter Date: 04/14/2015  PHYSICAL THERAPY DISCHARGE SUMMARY  Visits from Start of Care: 3  Current functional level related to goals / functional outcomes:     PT Long Term Goals - 12/10/14 1608    PT LONG TERM GOAL #1   Title Patients lower back  and leg flexiblity, pain and fatigue will improve so she can stand for >27mn so she can effectively engage in household actiivtes.   Baseline LImited to < 5 min   Time 8   Period Weeks   Status New   PT LONG TERM GOAL #2   Title Patiens lower back  and leg flexibility , pain and fatige will improve so she can walk community distances, at least 1500 feet so she can engage in recreational and self care actiivtes (shopping) w/LRAD   Baseline walking <100' with cane with wide base of support with pain exacerbating to severe levels   Time 8   Period Weeks   Status New   PT LONG TERM GOAL #3   Title Patient will be independent in a comprehensive home exercise program with several techniques to address the chronic pain/fibromyaligia ; evident by decreased frequency and intensity of pain.   Baseline unable to localize her pain; reporting pain 24/7 at mod to severe levels   Time 8   Period Weeks    Goals not fully addressed as pt did not return after 3rd visit.   Remaining deficits: pain   Education / Equipment: HEP  Plan: Patient agrees to discharge.  Patient goals were not met. Patient is being discharged due to financial reasons.  ?????per pt request.      MFrazier Butt 04/14/2015, 11:14 AM  MFrazier Butt, PT  [place] [address]Phone: [phone]   Fax:  [phone]

## 2015-07-25 ENCOUNTER — Encounter: Payer: Self-pay | Admitting: Internal Medicine

## 2015-09-16 ENCOUNTER — Ambulatory Visit (HOSPITAL_COMMUNITY)
Admission: EM | Admit: 2015-09-16 | Discharge: 2015-09-16 | Disposition: A | Discharge status: Home / Self Care | Payer: Medicare Other | Attending: Family Medicine | Admitting: Family Medicine

## 2015-09-16 ENCOUNTER — Encounter (HOSPITAL_COMMUNITY): Payer: Self-pay | Admitting: Emergency Medicine

## 2015-09-16 DIAGNOSIS — Z7984 Long term (current) use of oral hypoglycemic drugs: Secondary | ICD-10-CM | POA: Diagnosis not present

## 2015-09-16 DIAGNOSIS — Z79899 Other long term (current) drug therapy: Secondary | ICD-10-CM | POA: Diagnosis not present

## 2015-09-16 DIAGNOSIS — Z8673 Personal history of transient ischemic attack (TIA), and cerebral infarction without residual deficits: Secondary | ICD-10-CM | POA: Diagnosis not present

## 2015-09-16 DIAGNOSIS — Z833 Family history of diabetes mellitus: Secondary | ICD-10-CM | POA: Insufficient documentation

## 2015-09-16 DIAGNOSIS — I10 Essential (primary) hypertension: Secondary | ICD-10-CM | POA: Insufficient documentation

## 2015-09-16 DIAGNOSIS — J069 Acute upper respiratory infection, unspecified: Secondary | ICD-10-CM | POA: Insufficient documentation

## 2015-09-16 DIAGNOSIS — Z7982 Long term (current) use of aspirin: Secondary | ICD-10-CM | POA: Insufficient documentation

## 2015-09-16 LAB — POCT RAPID STREP A: Streptococcus, Group A Screen (Direct): NEGATIVE

## 2015-09-16 MED ORDER — IPRATROPIUM BROMIDE 0.06 % NA SOLN: 2.0000 | Freq: Four times a day (QID) | NASAL | Status: DC

## 2015-09-16 MED ORDER — HYDROCOD POLST-CPM POLST ER 10-8 MG/5ML PO SUER: 5.0000 mL | Freq: Two times a day (BID) | ORAL | Status: DC | PRN

## 2015-09-16 MED ORDER — AZITHROMYCIN 250 MG PO TABS: ORAL_TABLET | ORAL | Status: DC

## 2015-09-16 NOTE — ED Notes (Signed)
C/o cold sx onset 2 weeks associated w/prod cough, congestion, ST, hoarseness A&O x4... No acute distress.

## 2015-09-16 NOTE — ED Provider Notes (Signed)
CSN: [id number]     Arrival date & time 09/16/15  1849 History   First MD Initiated Contact with Patient 09/16/15 2000     Chief Complaint  Patient presents with  . URI   (Consider location/radiation/quality/duration/timing/severity/associated sxs/prior Treatment) Patient is a 50 y.o. female presenting with URI. The history is provided by the patient and a friend.  URI Presenting symptoms: congestion, rhinorrhea and sore throat   Presenting symptoms: no fever   Severity:  Mild Onset quality:  Sudden Progression:  Unchanged Chronicity:  New Relieved by:  None tried Worsened by:  Nothing tried   Past Medical History  Diagnosis Date  . Fibromyalgia   . Acid reflux   . Scoliosis   . Heart murmur   . Anxiety   . Chronic headaches   . Anemia   . Stroke ([place]) 1997  . Hypertension   . Degenerative disc disease     lumbar   Past Surgical History  Procedure Laterality Date  . Abdominal hysterectomy    . Cesarean section    . Laparoscopic unilateral salpingo oopherectomy Right 2005   Family History  Problem Relation Age of Onset  . Diabetes Mother   . Hyperlipidemia Mother   . Heart attack Father   . Diabetes Sister   . Diabetes Brother    Social History  Substance Use Topics  . Smoking status: Never Smoker   . Smokeless tobacco: None  . Alcohol Use: No   OB History    No data available     Review of Systems  Constitutional: Negative.  Negative for fever.  HENT: Positive for congestion, postnasal drip, rhinorrhea and sore throat.   All other systems reviewed and are negative.   Allergies  Milnacipran; Pregabalin; Topiramate; and Metronidazole  Home Medications   Prior to Admission medications   Medication Sig Start Date End Date Taking? Authorizing Provider  ACCU-CHEK AVIVA PLUS test strip  06/29/14  Yes Historical Provider, MD  metFORMIN (GLUCOPHAGE) 1000 MG tablet  10/20/14  Yes Historical Provider, MD  omeprazole (PRILOSEC) 20 MG capsule Take 1 capsule by  mouth once daily before a meal 10/13/14  Yes Historical Provider, MD  albuterol (PROVENTIL HFA;VENTOLIN HFA) 108 (90 BASE) MCG/ACT inhaler Inhale 1-2 puffs into the lungs every 6 (six) hours as needed for wheezing or shortness of breath.    Historical Provider, MD  Alcohol Swabs (ALCOHOL PREP) 70 % PADS  06/29/14   Historical Provider, MD  Ascorbic Acid (VITAMIN C) 1000 MG tablet Take 1,000 mg by mouth daily.    Historical Provider, MD  aspirin EC 81 MG tablet Take 1 tablet (81 mg total) by mouth daily. 08/05/14   Elmarie Shiley, MD  ASSURE COMFORT LANCETS 30G MISC  06/29/14   Historical Provider, MD  azithromycin (ZITHROMAX Z-PAK) 250 MG tablet Take as directed on pack 09/16/15   Billy Fischer, MD  benzonatate (TESSALON) 100 MG capsule Take 1 capsule (100 mg total) by mouth 3 (three) times daily as needed for cough. Patient not taking: Reported on 12/10/2014 04/12/13   Francine Graven, DO  Blood Glucose Calibration (ACCU-CHEK AVIVA) SOLN  06/29/14   Historical Provider, MD  Blood Glucose Monitoring Suppl (ACCU-CHEK AVIVA PLUS) W/DEVICE KIT  06/29/14   Historical Provider, MD  chlorpheniramine-HYDROcodone (TUSSIONEX PENNKINETIC ER) 10-8 MG/5ML SUER Take 5 mLs by mouth every 12 (twelve) hours as needed for cough. 09/16/15   Billy Fischer, MD  cyclobenzaprine (FLEXERIL) 10 MG tablet Take 10 mg by mouth 3 (  three) times daily as needed for muscle spasms.    Historical Provider, MD  DULoxetine (CYMBALTA) 30 MG capsule Take 30 mg by mouth daily. 08/11/14   Historical Provider, MD  Fish Oil-Cholecalciferol (FISH OIL + D3) 1200-1000 MG-UNIT CAPS Take 1 capsule by mouth daily.     Historical Provider, MD  furosemide (LASIX) 20 MG tablet Take 20 mg by mouth every other day.     Historical Provider, MD  ipratropium (ATROVENT) 0.06 % nasal spray Place 2 sprays into both nostrils 4 (four) times daily. 09/16/15   Billy Fischer, MD  Lancet Devices (ADJUSTABLE LANCING DEVICE) [place]  06/29/14   Historical Provider, MD   simvastatin (ZOCOR) 10 MG tablet Take 1 tablet (10 mg total) by mouth daily at 6 PM. 11/05/14   Rosalin Hawking, MD  tiZANidine (ZANAFLEX) 2 MG tablet Take 1 tablet by mouth every 6 hours as needed for muscle spasms 08/11/14   Historical Provider, MD  Vitamin D, Ergocalciferol, (DRISDOL) 50000 UNITS CAPS capsule Take 50,000 Units by mouth every 7 (seven) days.    Historical Provider, MD   Meds Ordered and Administered this Visit  Medications - No data to display  BP 120/84 mmHg  Pulse 93  Temp(Src) 98.7 F (37.1 C) (Oral)  Resp 18  SpO2 99% No data found.   Physical Exam  Constitutional: She is oriented to person, place, and time. She appears well-developed and well-nourished. No distress.  HENT:  Right Ear: External ear normal.  Left Ear: External ear normal.  Mouth/Throat: Oropharynx is clear and moist.  Eyes: Conjunctivae are normal. Pupils are equal, round, and reactive to light.  Neck: Normal range of motion. Neck supple.  Cardiovascular: Normal rate, normal heart sounds and intact distal pulses.   Pulmonary/Chest: Breath sounds normal.  Lymphadenopathy:    She has no cervical adenopathy.  Neurological: She is alert and oriented to person, place, and time.  Skin: Skin is warm and dry.  Nursing note and vitals reviewed.   ED Course  Procedures (including critical care time)  Labs Review Labs Reviewed  POCT RAPID STREP A   Strep neg  Imaging Review No results found.   Visual Acuity Review  Right Eye Distance:   Left Eye Distance:   Bilateral Distance:    Right Eye Near:   Left Eye Near:    Bilateral Near:         MDM   1. URI (upper respiratory infection)        Billy Fischer, MD 09/16/15 2009

## 2015-09-16 NOTE — Discharge Instructions (Signed)
Drink plenty of fluids as discussed, use medicine as prescribed, and mucinex or delsym for cough.  Avoid fans and A/C blowing in face in car or bedroom.Return or see your doctor if further problems

## 2015-09-18 LAB — CULTURE, GROUP A STREP (THRC)

## 2015-09-19 ENCOUNTER — Ambulatory Visit (AMBULATORY_SURGERY_CENTER): Payer: Self-pay | Admitting: *Deleted

## 2015-09-19 ENCOUNTER — Telehealth (HOSPITAL_COMMUNITY): Payer: Self-pay | Admitting: Emergency Medicine

## 2015-09-19 ENCOUNTER — Telehealth: Payer: Self-pay | Admitting: *Deleted

## 2015-09-19 VITALS — Ht 64.0 in | Wt 260.0 lb

## 2015-09-19 DIAGNOSIS — Z1211 Encounter for screening for malignant neoplasm of colon: Secondary | ICD-10-CM

## 2015-09-19 NOTE — Telephone Encounter (Signed)
Patient came in [facility] today for direct screening colonoscopy. Patient states she was dx with seizures around Oct.2016, she currently takes Vimpat. She see's Dr.Andrea Runheim. When asking patient when last seizure was or how frequently she keeps saying "I don't know", she states " just starts staring and if no one around then she doesn't know she had a seizure". She was also told she has seizures in her sleep. Patient okay for direct or need OV first? Please advise. Thanks Robbin,PV.

## 2015-09-19 NOTE — ED Notes (Signed)
LM on pt's VM [phone] Need to give lab results from recent visit on 6/23 Also let pt know labs can be obtained from MyChart    Per Dr. Piedad ClimesHonig,   Notes Recorded by Charm RingsErin J Honig, MD on 09/18/2015 at 6:45 PM Throat culture with few non-group A strep. [facility] Follow up as needed.

## 2015-09-19 NOTE — Progress Notes (Signed)
Patient denies any allergies to eggs or soy. Patient denies any problems with anesthesia/sedation. Patient denies any oxygen use at home and does not take any diet/weight loss medications. Patient is having Seizures. She unsure when and how frequently. Sent phone note to Dr.Gessner.

## 2015-09-27 NOTE — Telephone Encounter (Signed)
OK for direct My chart review does not show any true dx of seizures - ? Of that but they thought probably stroke last year

## 2015-09-28 NOTE — Telephone Encounter (Signed)
Pt notified to proceed with colon 7-10 as scheduled Helen [name] PV

## 2015-10-03 ENCOUNTER — Encounter: Payer: Self-pay | Admitting: Internal Medicine

## 2015-10-03 ENCOUNTER — Ambulatory Visit (AMBULATORY_SURGERY_CENTER): Payer: Medicare Other | Admitting: Internal Medicine

## 2015-10-03 VITALS — BP 105/67 | HR 69 | Temp 98.0°F | Resp 15 | Ht 64.0 in | Wt 260.0 lb

## 2015-10-03 DIAGNOSIS — Z1211 Encounter for screening for malignant neoplasm of colon: Secondary | ICD-10-CM

## 2015-10-03 LAB — GLUCOSE, CAPILLARY
Glucose-Capillary: 169 mg/dL — ABNORMAL HIGH (ref 65–99)
Glucose-Capillary: 132 mg/dL — ABNORMAL HIGH (ref 65–99)

## 2015-10-03 MED ORDER — SODIUM CHLORIDE 0.9 % IV SOLN: 500.0000 mL | INTRAVENOUS | Status: DC

## 2015-10-03 NOTE — Progress Notes (Signed)
Report given to PACU RN, vss 

## 2015-10-03 NOTE — Op Note (Signed)
[facility] Patient Name: Helen [name] Procedure Date: 10/03/2015 9:51 AM MRN: [id number] Endoscopist: Iva Boop , MD Age: 50 Referring MD:  Date of Birth: 02/16/1966 Gender: Female Account #: [id number] Procedure:                Colonoscopy Indications:              Screening for colorectal malignant neoplasm Medicines:                Propofol per Anesthesia, Monitored Anesthesia Care Procedure:                Pre-Anesthesia Assessment:                           - Prior to the procedure, a History and Physical                            was performed, and patient medications and                            allergies were reviewed. The patient's tolerance of                            previous anesthesia was also reviewed. The risks                            and benefits of the procedure and the sedation                            options and risks were discussed with the patient.                            All questions were answered, and informed consent                            was obtained. Prior Anticoagulants: The patient                            last took aspirin 4 days prior to the procedure.                            ASA Grade Assessment: III - A patient with severe                            systemic disease. After reviewing the risks and                            benefits, the patient was deemed in satisfactory                            condition to undergo the procedure.                           After obtaining informed consent, the colonoscope  was passed under direct vision. Throughout the                            procedure, the patient's blood pressure, pulse, and                            oxygen saturations were monitored continuously. The                            Model CF-HQ190L [id number](SN#[id number]) scope was introduced                            through the anus and advanced to the the cecum,   identified by appendiceal orifice and ileocecal                            valve. The quality of the bowel preparation was                            excellent. The colonoscopy was performed without                            difficulty. The patient tolerated the procedure                            well. The bowel preparation used was Miralax. The                            ileocecal valve, appendiceal orifice, and rectum                            were photographed. Scope In: 10:01:37 AM Scope Out: 10:13:09 AM Scope Withdrawal Time: 0 hours 8 minutes 11 seconds  Total Procedure Duration: 0 hours 11 minutes 32 seconds  Findings:                 The perianal and digital rectal examinations were                            normal.                           The colon (entire examined portion) appeared normal.                           No additional abnormalities were found on                            retroflexion. Complications:            No immediate complications. Estimated blood loss:                            None. Estimated Blood Loss:     Estimated blood loss: none. Recommendation:           - Repeat colonoscopy in 10 years for screening  purposes.                           - Resume aspirin today at prior dose.                           - Patient has a contact number available for                            emergencies. The signs and symptoms of potential                            delayed complications were discussed with the                            patient. Return to normal activities tomorrow.                            Written discharge instructions were provided to the                            patient.                           - Resume previous diet.                           - Continue present medications.                           - Continue present medications. Iva Boop, MD 10/03/2015 10:22:50 AM This report has been signed  electronically.

## 2015-10-03 NOTE — Patient Instructions (Addendum)
Your colonoscopy was normal!  Next routine colonoscopy/screening test in 10 years - 2027  Please say hi to Helen [name].  I appreciate the opportunity to care for you. Iva Booparl E. Gessner, MD, FACG      YOU HAD AN ENDOSCOPIC PROCEDURE TODAY AT THE [facility]:   Refer to the procedure report that was given to you for any specific questions about what was found during the examination.  If the procedure report does not answer your questions, please call your gastroenterologist to clarify.  If you requested that your care partner not be given the details of your procedure findings, then the procedure report has been included in a sealed envelope for you to review at your convenience later.  YOU SHOULD EXPECT: Some feelings of bloating in the abdomen. Passage of more gas than usual.  Walking can help get rid of the air that was put into your GI tract during the procedure and reduce the bloating. If you had a lower endoscopy (such as a colonoscopy or flexible sigmoidoscopy) you may notice spotting of blood in your stool or on the toilet paper. If you underwent a bowel prep for your procedure, you may not have a normal bowel movement for a few days.  Please Note:  You might notice some irritation and congestion in your nose or some drainage.  This is from the oxygen used during your procedure.  There is no need for concern and it should clear up in a day or so.  SYMPTOMS TO REPORT IMMEDIATELY:   Following lower endoscopy (colonoscopy or flexible sigmoidoscopy):  Excessive amounts of blood in the stool  Significant tenderness or worsening of abdominal pains  Swelling of the abdomen that is new, acute  Fever of 100F or higher   For urgent or emergent issues, a gastroenterologist can be reached at any hour by calling [phone].   DIET: Your first meal following the procedure should be a small meal and then it is ok to progress to your normal diet. Heavy or fried foods are  harder to digest and may make you feel nauseous or bloated.  Likewise, meals heavy in dairy and vegetables can increase bloating.  Drink plenty of fluids but you should avoid alcoholic beverages for 24 hours.  ACTIVITY:  You should plan to take it easy for the rest of today and you should NOT DRIVE or use heavy machinery until tomorrow (because of the sedation medicines used during the test).    FOLLOW UP: Our staff will call the number listed on your records the next business day following your procedure to check on you and address any questions or concerns that you may have regarding the information given to you following your procedure. If we do not reach you, we will leave a message.  However, if you are feeling well and you are not experiencing any problems, there is no need to return our call.  We will assume that you have returned to your regular daily activities without incident.  If any biopsies were taken you will be contacted by phone or by letter within the next 1-3 weeks.  Please call us at [phone] if you have not heard about the biopsies in 3 weeks.    SIGNATURES/CONFIDENTIALITY: You and/or your care partner have signed paperwork which will be entered into your electronic medical record.  These signatures attest to the fact that that the information above on your After Visit Summary has been reviewed and is understood.  Full responsibility of the confidentiality of this discharge information lies with you and/or your care-partner.   You may resume your aspirin today at prior dose. Your blood sugar was 132 in the recovery room. You may resume your other current medications today. Repeat colonoscopy in 10 years for screening purposes. Please call if any questions or concerns.

## 2015-10-03 NOTE — Progress Notes (Signed)
No problems noted in the recovery room. maw 

## 2015-10-04 ENCOUNTER — Telehealth: Payer: Self-pay

## 2015-10-04 NOTE — Telephone Encounter (Signed)
  Follow up Call-  Call back number 10/03/2015  Post procedure Call Back phone  # [phone]  Permission to leave phone message Yes     Patient questions:  Do you have a fever, pain , or abdominal swelling? No. Pain Score  0 *  Have you tolerated food without any problems? Yes.    Have you been able to return to your normal activities? Yes.    Do you have any questions about your discharge instructions: Diet   No. Medications  No. Follow up visit  No.  Do you have questions or concerns about your Care? No.  Actions: * If pain score is 4 or above: No action needed, pain <4.  Per the pt she had a small amount of bright, red blood from her rectum.  Pt said she thought it was a hemorrhoid.  I advised her to continue to monitor the bleeding and if it did not stop within a day or two, or the bleeding worsens to please call us back.  Pt said she would.  maw

## 2015-11-08 ENCOUNTER — Other Ambulatory Visit: Payer: Self-pay | Admitting: Internal Medicine

## 2015-11-08 DIAGNOSIS — Z1231 Encounter for screening mammogram for malignant neoplasm of breast: Secondary | ICD-10-CM

## 2015-11-15 ENCOUNTER — Ambulatory Visit
Admission: RE | Admit: 2015-11-15 | Discharge: 2015-11-15 | Disposition: A | Discharge status: Home / Self Care | Payer: Medicare Other | Source: Ambulatory Visit | Attending: Internal Medicine | Admitting: Internal Medicine

## 2015-11-15 DIAGNOSIS — Z1231 Encounter for screening mammogram for malignant neoplasm of breast: Secondary | ICD-10-CM

## 2015-12-12 ENCOUNTER — Emergency Department (HOSPITAL_COMMUNITY): Payer: Medicare Other

## 2015-12-12 ENCOUNTER — Encounter (HOSPITAL_COMMUNITY): Payer: Self-pay | Admitting: Emergency Medicine

## 2015-12-12 ENCOUNTER — Emergency Department (HOSPITAL_COMMUNITY)
Admission: EM | Admit: 2015-12-12 | Discharge: 2015-12-12 | Disposition: A | Discharge status: Home / Self Care | Payer: Medicare Other | Attending: Emergency Medicine | Admitting: Emergency Medicine

## 2015-12-12 DIAGNOSIS — Z5181 Encounter for therapeutic drug level monitoring: Secondary | ICD-10-CM | POA: Diagnosis not present

## 2015-12-12 DIAGNOSIS — G40909 Epilepsy, unspecified, not intractable, without status epilepticus: Secondary | ICD-10-CM | POA: Insufficient documentation

## 2015-12-12 DIAGNOSIS — Z7982 Long term (current) use of aspirin: Secondary | ICD-10-CM | POA: Diagnosis not present

## 2015-12-12 DIAGNOSIS — E119 Type 2 diabetes mellitus without complications: Secondary | ICD-10-CM | POA: Diagnosis not present

## 2015-12-12 DIAGNOSIS — Z7984 Long term (current) use of oral hypoglycemic drugs: Secondary | ICD-10-CM | POA: Insufficient documentation

## 2015-12-12 DIAGNOSIS — Z79899 Other long term (current) drug therapy: Secondary | ICD-10-CM | POA: Diagnosis not present

## 2015-12-12 DIAGNOSIS — Z794 Long term (current) use of insulin: Secondary | ICD-10-CM | POA: Diagnosis not present

## 2015-12-12 DIAGNOSIS — Z8673 Personal history of transient ischemic attack (TIA), and cerebral infarction without residual deficits: Secondary | ICD-10-CM | POA: Insufficient documentation

## 2015-12-12 DIAGNOSIS — M6281 Muscle weakness (generalized): Secondary | ICD-10-CM | POA: Diagnosis present

## 2015-12-12 DIAGNOSIS — I10 Essential (primary) hypertension: Secondary | ICD-10-CM | POA: Insufficient documentation

## 2015-12-12 DIAGNOSIS — R569 Unspecified convulsions: Secondary | ICD-10-CM

## 2015-12-12 LAB — CBC
HEMATOCRIT: 38.6 % (ref 36.0–46.0)
Hemoglobin: 12.1 g/dL (ref 12.0–15.0)
MCH: 27 pg (ref 26.0–34.0)
MCHC: 31.3 g/dL (ref 30.0–36.0)
MCV: 86.2 fL (ref 78.0–100.0)
Platelets: 287 10*3/uL (ref 150–400)
RBC: 4.48 MIL/uL (ref 3.87–5.11)
RDW: 14.4 % (ref 11.5–15.5)
WBC: 12.6 10*3/uL — AB (ref 4.0–10.5)

## 2015-12-12 LAB — DIFFERENTIAL
BASOS PCT: 0 %
Basophils Absolute: 0 10*3/uL (ref 0.0–0.1)
Eosinophils Absolute: 0.1 10*3/uL (ref 0.0–0.7)
Eosinophils Relative: 1 %
LYMPHS PCT: 31 %
Lymphs Abs: 3.9 10*3/uL (ref 0.7–4.0)
MONO ABS: 0.6 10*3/uL (ref 0.1–1.0)
MONOS PCT: 5 %
NEUTROS ABS: 8 10*3/uL — AB (ref 1.7–7.7)
Neutrophils Relative %: 63 %

## 2015-12-12 LAB — I-STAT CHEM 8, ED
BUN: 8 mg/dL (ref 6–20)
CHLORIDE: 102 mmol/L (ref 101–111)
CREATININE: 0.7 mg/dL (ref 0.44–1.00)
Calcium, Ion: 1.24 mmol/L (ref 1.15–1.40)
GLUCOSE: 146 mg/dL — AB (ref 65–99)
HCT: 40 % (ref 36.0–46.0)
Hemoglobin: 13.6 g/dL (ref 12.0–15.0)
POTASSIUM: 4.1 mmol/L (ref 3.5–5.1)
Sodium: 140 mmol/L (ref 135–145)
TCO2: 25 mmol/L (ref 0–100)

## 2015-12-12 LAB — COMPREHENSIVE METABOLIC PANEL
ALBUMIN: 3.5 g/dL (ref 3.5–5.0)
ALK PHOS: 75 U/L (ref 38–126)
ALT: 13 U/L — AB (ref 14–54)
ANION GAP: 7 (ref 5–15)
AST: 13 U/L — ABNORMAL LOW (ref 15–41)
BUN: 7 mg/dL (ref 6–20)
CALCIUM: 9.4 mg/dL (ref 8.9–10.3)
CHLORIDE: 103 mmol/L (ref 101–111)
CO2: 26 mmol/L (ref 22–32)
Creatinine, Ser: 0.82 mg/dL (ref 0.44–1.00)
GFR calc non Af Amer: >60 mL/min (ref >60)
GLUCOSE: 149 mg/dL — AB (ref 65–99)
Potassium: 3.9 mmol/L (ref 3.5–5.1)
SODIUM: 136 mmol/L (ref 135–145)
Total Bilirubin: 0.3 mg/dL (ref 0.3–1.2)
Total Protein: 7.2 g/dL (ref 6.5–8.1)

## 2015-12-12 LAB — PROTIME-INR
INR: 1.05
Prothrombin Time: 13.7 seconds (ref 11.4–15.2)

## 2015-12-12 LAB — I-STAT TROPONIN, ED: Troponin i, poc: 0 ng/mL (ref 0.00–0.08)

## 2015-12-12 LAB — APTT: aPTT: 31 seconds (ref 24–36)

## 2015-12-12 MED ORDER — LEVETIRACETAM 500 MG PO TABS: 500.0000 mg | ORAL_TABLET | Freq: Two times a day (BID) | ORAL | 0 refills | Status: DC

## 2015-12-12 MED ORDER — SODIUM CHLORIDE 0.9 % IV BOLUS (SEPSIS)
1000.0000 mL | Freq: Once | INTRAVENOUS | Status: AC
  Administered 2015-12-12: 1000 mL via INTRAVENOUS

## 2015-12-12 MED ORDER — HYDROCODONE-ACETAMINOPHEN 5-325 MG PO TABS
2.0000 | ORAL_TABLET | Freq: Once | ORAL | Status: AC
  Administered 2015-12-12: 2 via ORAL
  Filled 2015-12-12: qty 2

## 2015-12-12 MED ORDER — LEVETIRACETAM 500 MG PO TABS
500.0000 mg | ORAL_TABLET | Freq: Once | ORAL | Status: AC
  Administered 2015-12-12: 500 mg via ORAL
  Filled 2015-12-12: qty 1

## 2015-12-12 NOTE — ED Notes (Signed)
PT taken to MRI

## 2015-12-12 NOTE — ED Notes (Signed)
MD at bedside. 

## 2015-12-12 NOTE — ED Provider Notes (Signed)
[facility] DEPT Provider Note   CSN: [id number] Arrival date & time: 12/12/15  1755     History   Chief Complaint Chief Complaint  Patient presents with  . Extremity Weakness    HPI OLETHA TOLSON is a 50 y.o. female hx of anemia, DM, previous stroke, here with weakness. States that she has been weak on the left side since previous stroke. She states that she has been weaker for the 3 days. She had an episode 3 days ago when she fell to the left side and was weak. Yesterday, she had another episode where she starred into space for several seconds that was witnessed by an Engineer, production. She wasn't sure if she passed out at that time. Had had stroke in 1997 and was admitted a year ago for TIA workup and MRI and workup at that time was negative. She has neurologist at [facility]. Denies chest pain or shortness of breath.   The history is provided by the patient.    Past Medical History:  Diagnosis Date  . Acid reflux   . Anemia   . Anxiety   . Chronic headaches   . Degenerative disc disease    lumbar  . Diabetes mellitus without complication ([place])   . Fibromyalgia   . Heart murmur   . Hypertension   . Scoliosis   . Seizures ([place])   . Stroke [facility]) 1997    Patient Active Problem List   Diagnosis Date Noted  . HLD (hyperlipidemia) 11/05/2014  . Essential hypertension 11/05/2014  . Numbness 08/04/2014  . H/O fibromyalgia 08/04/2014  . Left upper extremity numbness   . Numbness of left lower extremity   . UTI 07/01/2008  . DIARRHEA, ACUTE 06/11/2008  . GERD 05/28/2008  . PERSISTENT DISORDER INITIATING/MAINTAINING SLEEP 04/01/2008  . STROKE 04/01/2008  . HEADACHE, CHRONIC 04/01/2008  . Morbid obesity ([place]) 03/16/2008  . BACK PAIN 12/17/2007  . Sleep apnea 12/17/2007  . 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 FIBROMYALGIA 12/03/2007    Past Surgical History:  Procedure Laterality Date  . ABDOMINAL HYSTERECTOMY    . CESAREAN SECTION    . LAPAROSCOPIC  UNILATERAL SALPINGO OOPHERECTOMY Right 2005    OB History    No data available       Home Medications    Prior to Admission medications   Medication Sig Start Date End Date Taking? Authorizing Provider  diclofenac sodium (VOLTAREN) 1 % GEL Apply 1 application topically.   Yes Historical Provider, MD  glipiZIDE-metformin (METAGLIP) 5-500 MG tablet Take 2 tablets by mouth 2 (two) times daily as needed (CBG >150).  08/19/15  Yes Historical Provider, MD  insulin regular (NOVOLIN R,HUMULIN R) 100 units/mL injection Inject 4-6 Units into the skin daily as needed for high blood sugar (CBG >150).   Yes Historical Provider, MD  Lacosamide (VIMPAT) 150 MG TABS Take 150 mg by mouth 2 (two) times daily.   Yes Historical Provider, MD  metoprolol tartrate (LOPRESSOR) 25 MG tablet Take 25 mg by mouth daily. 11/25/15  Yes Historical Provider, MD  omeprazole (PRILOSEC) 20 MG capsule Take 20 mg by mouth daily before breakfast.   Yes Historical Provider, MD  phentermine (ADIPEX-P) 37.5 MG tablet Take 37.5 mg by mouth daily after breakfast. 11/25/15  Yes Historical Provider, MD  simvastatin (ZOCOR) 10 MG tablet Take 1 tablet (10 mg total) by mouth daily at 6 PM. 11/05/14  Yes Rosalin Hawking, MD  tiZANidine (ZANAFLEX) 2 MG tablet Take 2 mg by mouth  daily as needed (fibromyalgia).   Yes Historical Provider, MD  ACCU-CHEK AVIVA PLUS test strip  06/29/14   Historical Provider, MD  albuterol (PROVENTIL HFA;VENTOLIN HFA) 108 (90 BASE) MCG/ACT inhaler Inhale 1-2 puffs into the lungs every 6 (six) hours as needed for wheezing or shortness of breath. Reported on 09/19/2015    Historical Provider, MD  Alcohol Swabs (ALCOHOL PREP) 70 % PADS  06/29/14   Historical Provider, MD  Ascorbic Acid (VITAMIN C) 1000 MG tablet Take 1,000 mg by mouth daily.    Historical Provider, MD  aspirin EC 81 MG tablet Take 1 tablet (81 mg total) by mouth daily. 08/05/14   Belkys A Regalado, MD  ASSURE COMFORT LANCETS 30G MISC  06/29/14   Historical Provider,  MD  benzonatate (TESSALON) 100 MG capsule Take 1 capsule (100 mg total) by mouth 3 (three) times daily as needed for cough. 04/12/13   Francine Graven, DO  Blood Glucose Calibration (ACCU-CHEK AVIVA) SOLN  06/29/14   Historical Provider, MD  Blood Glucose Monitoring Suppl (ACCU-CHEK AVIVA PLUS) W/DEVICE KIT  06/29/14   Historical Provider, MD  chlorpheniramine-HYDROcodone (TUSSIONEX PENNKINETIC ER) 10-8 MG/5ML SUER Take 5 mLs by mouth every 12 (twelve) hours as needed for cough. 09/16/15   Billy Fischer, MD  cyclobenzaprine (FLEXERIL) 10 MG tablet Take 10 mg by mouth 3 (three) times daily as needed for muscle spasms. Reported on 10/03/2015    Historical Provider, MD  DULoxetine (CYMBALTA) 30 MG capsule Take 30 mg by mouth daily. 08/11/14   Historical Provider, MD  Fish Oil-Cholecalciferol (FISH OIL + D3) 1200-1000 MG-UNIT CAPS Take 1 capsule by mouth daily.     Historical Provider, MD  furosemide (LASIX) 20 MG tablet Take 20 mg by mouth every other day.     Historical Provider, MD  ipratropium (ATROVENT) 0.06 % nasal spray Place 2 sprays into both nostrils 4 (four) times daily. 09/16/15   Billy Fischer, MD  Lancet Devices (ADJUSTABLE LANCING DEVICE) [place]  06/29/14   Historical Provider, MD  Vitamin D, Ergocalciferol, (DRISDOL) 50000 UNITS CAPS capsule Take 50,000 Units by mouth every 7 (seven) days.    Historical Provider, MD    Family History Family History  Problem Relation Age of Onset  . Diabetes Mother   . Hyperlipidemia Mother   . Heart attack Father   . Diabetes Sister   . Diabetes Brother   . Colon cancer Neg Hx     Social History Social History  Substance Use Topics  . Smoking status: Never Smoker  . Smokeless tobacco: Never Used  . Alcohol use No     Allergies   Milnacipran; Pregabalin; Topiramate; and Metronidazole   Review of Systems Review of Systems  Neurological: Positive for speech difficulty and weakness.  All other systems reviewed and are negative.    Physical  Exam Updated Vital Signs BP 119/83   Pulse 74   Resp 16   Ht 5' 4" (1.626 m)   Wt 262 lb (118.8 kg)   SpO2 99%   BMI 44.97 kg/m   Physical Exam  Constitutional: She is oriented to person, place, and time.  Chronically ill,   HENT:  Head: Normocephalic.  Mouth/Throat: Oropharynx is clear and moist.  Eyes: EOM are normal. Pupils are equal, round, and reactive to light.  Neck: Normal range of motion. Neck supple.  Cardiovascular: Normal rate, regular rhythm and normal heart sounds.   Pulmonary/Chest: Effort normal and breath sounds normal. No respiratory distress. She has no wheezes.  She has no rales.  Abdominal: Soft. Bowel sounds are normal. She exhibits no distension. There is no tenderness. There is no guarding.  Musculoskeletal: Normal range of motion.  Neurological: She is alert and oriented to person, place, and time.  CN 2-12 intact. Strength 4/5 L arm/leg (chronic), 5/5 R side.   Skin: Skin is warm.  Psychiatric: She has a normal mood and affect.  Nursing note and vitals reviewed.    ED Treatments / Results  Labs (all labs ordered are listed, but only abnormal results are displayed) Labs Reviewed  CBC - Abnormal; Notable for the following:       Result Value   WBC 12.6 (*)    All other components within normal limits  DIFFERENTIAL - Abnormal; Notable for the following:    Neutro Abs 8.0 (*)    All other components within normal limits  COMPREHENSIVE METABOLIC PANEL - Abnormal; Notable for the following:    Glucose, Bld 149 (*)    AST 13 (*)    ALT 13 (*)    All other components within normal limits  I-STAT CHEM 8, ED - Abnormal; Notable for the following:    Glucose, Bld 146 (*)    All other components within normal limits  PROTIME-INR  APTT  ETHANOL  URINE RAPID DRUG SCREEN, HOSP PERFORMED  URINALYSIS, ROUTINE W REFLEX MICROSCOPIC (NOT AT [facility])  I-STAT TROPOININ, ED    EKG  EKG Interpretation  Date/Time:  Monday December 12 2015 18:03:09  EDT Ventricular Rate:  78 PR Interval:    QRS Duration: 81 QT Interval:  364 QTC Calculation: 415 R Axis:   16 Text Interpretation:  Sinus rhythm Low voltage, precordial leads Borderline T abnormalities, anterior leads No significant change since last tracing Confirmed by YAO  MD, DAVID ([id number]) on 12/12/2015 6:06:19 PM       Radiology Ct Head Wo Contrast  Result Date: 12/12/2015 CLINICAL DATA:  Left-sided weakness EXAM: CT HEAD WITHOUT CONTRAST TECHNIQUE: Contiguous axial images were obtained from the base of the skull through the vertex without intravenous contrast. COMPARISON:  MRI 01/01/2015 FINDINGS: Brain: No evidence of acute infarction, hemorrhage, hydrocephalus, extra-axial collection or mass lesion/mass effect. Vascular: No hyperdense vessel or unexpected calcification. Skull: Normal. Negative for fracture or focal lesion. Sinuses/Orbits: No acute finding. Other: None. IMPRESSION: Negative Electronically Signed   By: Franchot Gallo M.D.   On: 12/12/2015 19:12   Mr Brain Wo Contrast  Result Date: 12/12/2015 CLINICAL DATA:  Tingling in left arm. Left arm weakness. Slurred speech. Or EXAM: MRI HEAD WITHOUT CONTRAST TECHNIQUE: Multiplanar, multiecho pulse sequences of the brain and surrounding structures were obtained without intravenous contrast. COMPARISON:  PET-CT 12/12/2015, brain MRI 01/01/2015 FINDINGS: Brain: No acute infarct or intraparenchymal hemorrhage. The midline structures are normal. No focal parenchymal signal abnormality. No mass lesion or midline shift. No hydrocephalus or extra-axial fluid collection. Vascular: Major intracranial flow voids are preserved. No evidence of chronic microhemorrhage or amyloid angiopathy. Skull and upper cervical spine: The visualized skull base, calvarium, upper cervical spine and extracranial soft tissues are normal. Sinuses/Orbits: No fluid levels or advanced mucosal thickening. No mastoid effusion. Normal orbits. IMPRESSION: Normal brain MRI.  Electronically Signed   By: Ulyses Jarred M.D.   On: 12/12/2015 21:15    Procedures Procedures (including critical care time)  Medications Ordered in ED Medications  sodium chloride 0.9 % bolus 1,000 mL (not administered)     Initial Impression / Assessment and Plan / ED Course  I have  reviewed the triage vital signs and the nursing notes.  Pertinent labs & imaging results that were available during my care of the patient were reviewed by me and considered in my medical decision making (see chart for details).  Clinical Course   ENVI EAGLESON is a 50 y.o. female here with seizure vs stroke. Has chronic L sided weakness that is worsening compared to previous. Symptoms for 2 days so will get MRI brain, labs.   9:55 PM Labs at baseline. MRI brain showed no acute infarct. I consulted Dr. Nicole Kindred from neurology, who feels that patient likely had a seizure. Recommend keppra 500 mg BID. Has neurology follow up at [place] [facility].    Final Clinical Impressions(s) / ED Diagnoses   Final diagnoses:  None    New Prescriptions New Prescriptions   No medications on file     Drenda Freeze, MD 12/12/15 2156

## 2015-12-12 NOTE — ED Triage Notes (Signed)
EMS was called by PT's PCP office. Pt reports Saturday she began having tingling in her left arm, left arm weakness, subjective slurred speech, and a syncopal episode that was witnessed by her nursing aid. PT reports continued tingling on Sunday. PT has history of CVA that affected left side. PT reports chest tightness that started Satruday with raditation  To neck. PT had 324 mg ASA and 1 SL nitro with EMS with no changes.

## 2015-12-12 NOTE — Discharge Instructions (Signed)
Add keppra 500 mg twice daily  [facility]   Return to ER if you have worse weakness, numbness, trouble speaking, passing out, chest pain

## 2017-01-14 ENCOUNTER — Other Ambulatory Visit: Payer: Self-pay | Admitting: Internal Medicine

## 2017-01-14 DIAGNOSIS — Z1231 Encounter for screening mammogram for malignant neoplasm of breast: Secondary | ICD-10-CM

## 2017-01-29 ENCOUNTER — Ambulatory Visit
Admission: RE | Admit: 2017-01-29 | Discharge: 2017-01-29 | Disposition: A | Discharge status: Home / Self Care | Payer: Medicare Other | Source: Ambulatory Visit | Attending: Internal Medicine | Admitting: Internal Medicine

## 2017-01-29 DIAGNOSIS — Z1231 Encounter for screening mammogram for malignant neoplasm of breast: Secondary | ICD-10-CM

## 2017-04-02 ENCOUNTER — Emergency Department (HOSPITAL_COMMUNITY)
Admission: EM | Admit: 2017-04-02 | Discharge: 2017-04-02 | Disposition: A | Discharge status: Home / Self Care | Payer: Medicare Other | Attending: Emergency Medicine | Admitting: Emergency Medicine

## 2017-04-02 ENCOUNTER — Other Ambulatory Visit: Payer: Self-pay

## 2017-04-02 DIAGNOSIS — J029 Acute pharyngitis, unspecified: Secondary | ICD-10-CM | POA: Diagnosis not present

## 2017-04-02 DIAGNOSIS — J069 Acute upper respiratory infection, unspecified: Secondary | ICD-10-CM | POA: Insufficient documentation

## 2017-04-02 DIAGNOSIS — Z794 Long term (current) use of insulin: Secondary | ICD-10-CM | POA: Insufficient documentation

## 2017-04-02 DIAGNOSIS — Z79899 Other long term (current) drug therapy: Secondary | ICD-10-CM | POA: Insufficient documentation

## 2017-04-02 DIAGNOSIS — E119 Type 2 diabetes mellitus without complications: Secondary | ICD-10-CM | POA: Insufficient documentation

## 2017-04-02 DIAGNOSIS — R070 Pain in throat: Secondary | ICD-10-CM | POA: Diagnosis present

## 2017-04-02 DIAGNOSIS — B9789 Other viral agents as the cause of diseases classified elsewhere: Secondary | ICD-10-CM | POA: Insufficient documentation

## 2017-04-02 DIAGNOSIS — Z7982 Long term (current) use of aspirin: Secondary | ICD-10-CM | POA: Diagnosis not present

## 2017-04-02 DIAGNOSIS — Z8673 Personal history of transient ischemic attack (TIA), and cerebral infarction without residual deficits: Secondary | ICD-10-CM | POA: Insufficient documentation

## 2017-04-02 DIAGNOSIS — I10 Essential (primary) hypertension: Secondary | ICD-10-CM | POA: Diagnosis not present

## 2017-04-02 MED ORDER — BENZONATATE 100 MG PO CAPS: 100.0000 mg | ORAL_CAPSULE | Freq: Three times a day (TID) | ORAL | 0 refills | Status: DC | PRN

## 2017-04-02 MED ORDER — CETIRIZINE HCL 10 MG PO TABS: 10.0000 mg | ORAL_TABLET | Freq: Every day | ORAL | 1 refills | Status: DC

## 2017-04-02 MED ORDER — ACETAMINOPHEN 325 MG PO TABS: 650.0000 mg | ORAL_TABLET | Freq: Four times a day (QID) | ORAL | 0 refills | Status: AC | PRN

## 2017-04-02 MED ORDER — FLUTICASONE PROPIONATE 50 MCG/ACT NA SUSP: 2.0000 | Freq: Every day | NASAL | 0 refills | Status: DC

## 2017-04-02 NOTE — ED Provider Notes (Signed)
[place] EMERGENCY DEPARTMENT Provider Note   CSN: [id number] Arrival date & time: 04/02/17  1012     History   Chief Complaint Chief Complaint  Patient presents with  . Sore Throat    HPI Helen [name] is a 52 y.o. female.  Helen [name] is a 52 y.o. Female who presents to the emergent 4 days of sore throat, nasal congestion, postnasal drip, sneezing, ear pressure and cough.  Patient reports her symptoms today with her sore throat.  She has had no trouble swallowing.  She reports postnasal drip and coughing up mucus.  No shortness of breath or trouble breathing.  She took cough suppressant today with little relief.  No antipyretics prior to arrival.  No known fevers.  She denies fevers, abdominal pain, diarrhea, rashes, neck stiffness, trouble swallowing, drooling, chest pain or shortness of breath.   The history is provided by the patient and medical records. No language interpreter was used.  Sore Throat  Pertinent negatives include no chest pain, no abdominal pain, no headaches and no shortness of breath.    Past Medical History:  Diagnosis Date  . Acid reflux   . Anemia   . Anxiety   . Chronic headaches   . Degenerative disc disease    lumbar  . Diabetes mellitus without complication ([place])   . Fibromyalgia   . Heart murmur   . Hypertension   . Scoliosis   . Seizures ([place])   . Stroke [facility]) 1997    Patient Active Problem List   Diagnosis Date Noted  . HLD (hyperlipidemia) 11/05/2014  . Essential hypertension 11/05/2014  . Numbness 08/04/2014  . H/O fibromyalgia 08/04/2014  . Left upper extremity numbness   . Numbness of left lower extremity   . UTI 07/01/2008  . DIARRHEA, ACUTE 06/11/2008  . GERD 05/28/2008  . PERSISTENT DISORDER INITIATING/MAINTAINING SLEEP 04/01/2008  . STROKE 04/01/2008  . HEADACHE, CHRONIC 04/01/2008  . Morbid obesity ([place]) 03/16/2008  . BACK PAIN 12/17/2007  . Sleep apnea 12/17/2007  . GEN NONCONVUL EPILEPSY  W/O INTRACT EPILEPSY 12/03/2007  . FIBROMYALGIA 12/03/2007    Past Surgical History:  Procedure Laterality Date  . ABDOMINAL HYSTERECTOMY    . CESAREAN SECTION    . LAPAROSCOPIC UNILATERAL SALPINGO OOPHERECTOMY Right 2005    OB History    No data available       Home Medications    Prior to Admission medications   Medication Sig Start Date End Date Taking? Authorizing Provider  ACCU-CHEK AVIVA PLUS test strip  06/29/14   [provider]  acetaminophen (TYLENOL) 325 MG tablet Take 2 tablets (650 mg total) by mouth every 6 (six) hours as needed for mild pain, moderate pain or headache. 04/02/17   Waynetta Pean, PA-C  Alcohol Swabs (ALCOHOL PREP) 70 % PADS  06/29/14   [provider]  aspirin EC 81 MG tablet Take 1 tablet (81 mg total) by mouth daily. 08/05/14   Regalado, Cassie Freer, MD  ASSURE COMFORT LANCETS 30G MISC  06/29/14   [provider]  benzonatate (TESSALON) 100 MG capsule Take 1 capsule (100 mg total) by mouth 3 (three) times daily as needed. 04/02/17   Waynetta Pean, PA-C  Blood Glucose Calibration (ACCU-CHEK AVIVA) SOLN  06/29/14   [provider]  Blood Glucose Monitoring Suppl (ACCU-CHEK AVIVA PLUS) W/DEVICE KIT  06/29/14   [provider]  cetirizine (ZYRTEC ALLERGY) 10 MG tablet Take 1 tablet (10 mg total) by mouth daily. 04/02/17  Waynetta Pean, PA-C  cholecalciferol (VITAMIN D) 1000 units tablet Take 1,000 Units by mouth daily.    [provider]  diclofenac sodium (VOLTAREN) 1 % GEL Apply 1 application topically daily as needed (back pain).     [provider]  fluticasone (FLONASE) 50 MCG/ACT nasal spray Place 2 sprays into both nostrils daily. 04/02/17   Waynetta Pean, PA-C  furosemide (LASIX) 20 MG tablet Take 20 mg by mouth daily.     [provider]  glipiZIDE-metformin (METAGLIP) 5-500 MG tablet Take 2 tablets by mouth 2 (two) times daily as needed (CBG >150).  08/19/15   [provider]    insulin regular (NOVOLIN R,HUMULIN R) 100 units/mL injection Inject 4-6 Units into the skin daily as needed for high blood sugar (CBG >150).    [provider]  ipratropium (ATROVENT) 0.06 % nasal spray Place 2 sprays into both nostrils 4 (four) times daily. Patient not taking: Reported on 12/12/2015 09/16/15   Billy Fischer, MD  Lacosamide (VIMPAT) 150 MG TABS Take 150 mg by mouth 2 (two) times daily.    [provider]  Lancet Devices (ADJUSTABLE LANCING DEVICE) MISC  06/29/14   [provider]  levETIRAcetam (KEPPRA) 500 MG tablet Take 1 tablet (500 mg total) by mouth 2 (two) times daily. 12/12/15   Drenda Freeze, MD  metoprolol tartrate (LOPRESSOR) 25 MG tablet Take 25 mg by mouth daily. 11/25/15   [provider]  omeprazole (PRILOSEC) 20 MG capsule Take 20 mg by mouth daily before breakfast.    [provider]  phentermine (ADIPEX-P) 37.5 MG tablet Take 37.5 mg by mouth daily after breakfast. 11/25/15   [provider]  simvastatin (ZOCOR) 10 MG tablet Take 1 tablet (10 mg total) by mouth daily at 6 PM. 11/05/14   Rosalin Hawking, MD  tiZANidine (ZANAFLEX) 2 MG tablet Take 2 mg by mouth daily as needed (fibromyalgia).    [provider]    Family History Family History  Problem Relation Age of Onset  . Diabetes Mother   . Hyperlipidemia Mother   . Heart attack Father   . Diabetes Sister   . Diabetes Brother   . Breast cancer Paternal Grandmother   . Colon cancer Neg Hx     Social History Social History   Tobacco Use  . Smoking status: Never Smoker  . Smokeless tobacco: Never Used  Substance Use Topics  . Alcohol use: No  . Drug use: No     Allergies   Milnacipran; Pregabalin; Topiramate; and Metronidazole   Review of Systems Review of Systems  Constitutional: Positive for chills. Negative for fever.  HENT: Positive for congestion, ear pain, postnasal drip, rhinorrhea, sinus pressure, sinus pain, sneezing and  sore throat. Negative for ear discharge, mouth sores, trouble swallowing and voice change.   Eyes: Negative for visual disturbance.  Respiratory: Positive for cough. Negative for shortness of breath and wheezing.   Cardiovascular: Negative for chest pain and palpitations.  Gastrointestinal: Negative for abdominal pain.  Genitourinary: Negative for dysuria.  Musculoskeletal: Negative for back pain and neck pain.  Skin: Negative for rash.  Neurological: Negative for headaches.     Physical Exam Updated Vital Signs BP 105/66 (BP Location: Right Arm)   Pulse 92   Temp 99.5 F (37.5 C) (Oral)   Resp 16   SpO2 100%   Physical Exam  Constitutional: She appears well-developed and well-nourished.  Non-toxic appearance. She does not appear ill. No distress.  HENT:  Head: Normocephalic and atraumatic.  Right Ear: No drainage. A middle ear effusion is present.  Left Ear: No drainage. A middle ear effusion is present.  Mouth/Throat: Uvula is midline. No oral lesions. No uvula swelling. Posterior oropharyngeal erythema present. No oropharyngeal exudate, posterior oropharyngeal edema or tonsillar abscesses. Tonsils are 1+ on the right. Tonsils are 1+ on the left. No tonsillar exudate.  Boggy nasal turbinates bilaterally.  Evidence of postnasal drip.  No tonsillar exudate.  No peritonsillar abscess.  No trismus.  No drooling.  Eyes: Conjunctivae are normal. Pupils are equal, round, and reactive to light. Right eye exhibits no discharge. Left eye exhibits no discharge.  Neck: Normal range of motion. Neck supple.  Cardiovascular: Normal rate, regular rhythm, normal heart sounds and intact distal pulses.  Pulmonary/Chest: Effort normal and breath sounds normal. No respiratory distress. She has no wheezes. She has no rhonchi. She has no rales.  Lungs are clear to ascultation bilaterally. Symmetric chest expansion bilaterally. No increased work of breathing. No rales or rhonchi.    Abdominal: Soft.  There is no tenderness.  Lymphadenopathy:    She has no cervical adenopathy.  Neurological: She is alert. Coordination normal.  Skin: Skin is warm and dry. Capillary refill takes less than 2 seconds. No rash noted. She is not diaphoretic.  Psychiatric: She has a normal mood and affect. Her behavior is normal.  Nursing note and vitals reviewed.    ED Treatments / Results  Labs (all labs ordered are listed, but only abnormal results are displayed) Labs Reviewed - No data to display  EKG  EKG Interpretation None       Radiology No results found.  Procedures Procedures (including critical care time)  Medications Ordered in ED Medications - No data to display   Initial Impression / Assessment and Plan / ED Course  I have reviewed the triage vital signs and the nursing notes.  Pertinent labs & imaging results that were available during my care of the patient were reviewed by me and considered in my medical decision making (see chart for details).    This  is a 52 y.o. Female who presents to the emergent 4 days of sore throat, nasal congestion, postnasal drip, sneezing, ear pressure and cough.  Patient reports her symptoms today with her sore throat.  She has had no trouble swallowing.  She reports postnasal drip and coughing up mucus.  No shortness of breath or trouble breathing. On exam the patient is afebrile nontoxic-appearing.  Lungs are clear to auscultation bilaterally.  She has rhinorrhea and boggy nasal turbinates bilaterally.  Mild clear middle ear effusion noted bilaterally without TM erythema or loss of landmarks.  Mild bilateral tonsillar hypertrophy without exudates.  Uvula is midline without edema.  No peritonsillar abscess.  No trismus.  No drooling.  Patient with upper respiratory infection and viral pharyngitis.  Will discharge with Flonase, Zyrtec, Tessalon Perles and Tylenol. I advised the patient to follow-up with their primary care provider this week. I advised  the patient to return to the emergency department with new or worsening symptoms or new concerns. The patient verbalized understanding and agreement with plan.      Final Clinical Impressions(s) / ED Diagnoses   Final diagnoses:  Viral URI with cough  Viral pharyngitis    ED Discharge Orders        Ordered    fluticasone (FLONASE) 50 MCG/ACT nasal spray  Daily     04/02/17 1307    cetirizine (ZYRTEC ALLERGY)  10 MG tablet  Daily     04/02/17 1307    benzonatate (TESSALON) 100 MG capsule  3 times daily PRN     04/02/17 1307    acetaminophen (TYLENOL) 325 MG tablet  Every 6 hours PRN     04/02/17 1307       Waynetta Pean, PA-C 04/02/17 1313    Pattricia Boss, MD 04/03/17 1547

## 2017-04-02 NOTE — ED Triage Notes (Signed)
Pt states since yesterday she has been having a sore throat with pain with swallowing and pressure in face and head.

## 2017-09-17 ENCOUNTER — Other Ambulatory Visit: Payer: Self-pay

## 2017-09-17 ENCOUNTER — Emergency Department (HOSPITAL_BASED_OUTPATIENT_CLINIC_OR_DEPARTMENT_OTHER)
Admission: EM | Admit: 2017-09-17 | Discharge: 2017-09-17 | Disposition: A | Discharge status: Home / Self Care | Payer: Medicare Other | Attending: Physician Assistant | Admitting: Physician Assistant

## 2017-09-17 ENCOUNTER — Encounter (HOSPITAL_BASED_OUTPATIENT_CLINIC_OR_DEPARTMENT_OTHER): Payer: Self-pay

## 2017-09-17 DIAGNOSIS — T2112XA Burn of first degree of abdominal wall, initial encounter: Secondary | ICD-10-CM

## 2017-09-17 DIAGNOSIS — T2152XA Corrosion of first degree of abdominal wall, initial encounter: Secondary | ICD-10-CM | POA: Insufficient documentation

## 2017-09-17 DIAGNOSIS — E119 Type 2 diabetes mellitus without complications: Secondary | ICD-10-CM | POA: Diagnosis not present

## 2017-09-17 DIAGNOSIS — I10 Essential (primary) hypertension: Secondary | ICD-10-CM | POA: Insufficient documentation

## 2017-09-17 DIAGNOSIS — Y998 Other external cause status: Secondary | ICD-10-CM | POA: Insufficient documentation

## 2017-09-17 DIAGNOSIS — Z79899 Other long term (current) drug therapy: Secondary | ICD-10-CM | POA: Insufficient documentation

## 2017-09-17 DIAGNOSIS — Y93E8 Activity, other personal hygiene: Secondary | ICD-10-CM | POA: Insufficient documentation

## 2017-09-17 DIAGNOSIS — Z794 Long term (current) use of insulin: Secondary | ICD-10-CM | POA: Insufficient documentation

## 2017-09-17 DIAGNOSIS — Y929 Unspecified place or not applicable: Secondary | ICD-10-CM | POA: Diagnosis not present

## 2017-09-17 DIAGNOSIS — E785 Hyperlipidemia, unspecified: Secondary | ICD-10-CM | POA: Insufficient documentation

## 2017-09-17 DIAGNOSIS — Z7982 Long term (current) use of aspirin: Secondary | ICD-10-CM | POA: Insufficient documentation

## 2017-09-17 DIAGNOSIS — Z8673 Personal history of transient ischemic attack (TIA), and cerebral infarction without residual deficits: Secondary | ICD-10-CM | POA: Diagnosis not present

## 2017-09-17 DIAGNOSIS — T65891A Toxic effect of other specified substances, accidental (unintentional), initial encounter: Secondary | ICD-10-CM | POA: Diagnosis not present

## 2017-09-17 DIAGNOSIS — Y33XXXA Other specified events, undetermined intent, initial encounter: Secondary | ICD-10-CM | POA: Diagnosis not present

## 2017-09-17 MED ORDER — CLINDAMYCIN HCL 300 MG PO CAPS: 300.0000 mg | ORAL_CAPSULE | Freq: Three times a day (TID) | ORAL | 0 refills | Status: AC

## 2017-09-17 MED ORDER — MUPIROCIN CALCIUM 2 % EX CREA: 1.0000 "application " | TOPICAL_CREAM | Freq: Two times a day (BID) | CUTANEOUS | 0 refills | Status: AC
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
## 2017-09-17 NOTE — ED Notes (Signed)
Pt verbalizes understanding of d/c instructions and denies any further needs at this time. 

## 2017-09-17 NOTE — ED Notes (Signed)
ED Provider at bedside. 
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
## 2017-12-04 ENCOUNTER — Other Ambulatory Visit: Payer: Self-pay | Admitting: Family Medicine

## 2017-12-04 DIAGNOSIS — Z1231 Encounter for screening mammogram for malignant neoplasm of breast: Secondary | ICD-10-CM

## 2018-02-03 ENCOUNTER — Ambulatory Visit
Admission: RE | Admit: 2018-02-03 | Discharge: 2018-02-03 | Disposition: A | Discharge status: Home / Self Care | Payer: Medicare Other | Source: Ambulatory Visit | Attending: Family Medicine | Admitting: Family Medicine

## 2018-02-03 DIAGNOSIS — Z1231 Encounter for screening mammogram for malignant neoplasm of breast: Secondary | ICD-10-CM

## 2018-06-27 ENCOUNTER — Other Ambulatory Visit: Payer: Self-pay

## 2018-06-27 MED ORDER — OLMESARTAN MEDOXOMIL-HCTZ 20-12.5 MG PO TABS: 1.0000 | ORAL_TABLET | Freq: Every day | ORAL | 1 refills | Status: DC

## 2018-08-27 ENCOUNTER — Other Ambulatory Visit: Payer: Self-pay

## 2018-08-27 DIAGNOSIS — I10 Essential (primary) hypertension: Secondary | ICD-10-CM

## 2018-08-27 MED ORDER — OLMESARTAN MEDOXOMIL-HCTZ 20-12.5 MG PO TABS: 1.0000 | ORAL_TABLET | Freq: Every day | ORAL | 1 refills | Status: AC

## 2019-03-11 ENCOUNTER — Encounter: Payer: Self-pay | Admitting: Neurology

## 2019-05-26 ENCOUNTER — Other Ambulatory Visit: Payer: Self-pay

## 2019-05-26 ENCOUNTER — Encounter: Payer: Self-pay | Admitting: Neurology

## 2019-05-26 ENCOUNTER — Ambulatory Visit (INDEPENDENT_AMBULATORY_CARE_PROVIDER_SITE_OTHER): Payer: Medicare Other | Admitting: Neurology

## 2019-05-26 VITALS — BP 122/86 | HR 104 | Ht 63.5 in | Wt 262.4 lb

## 2019-05-26 DIAGNOSIS — G40909 Epilepsy, unspecified, not intractable, without status epilepticus: Secondary | ICD-10-CM

## 2019-05-26 DIAGNOSIS — M25512 Pain in left shoulder: Secondary | ICD-10-CM | POA: Diagnosis not present

## 2019-05-26 MED ORDER — LEVETIRACETAM 500 MG PO TABS: 500.0000 mg | ORAL_TABLET | Freq: Two times a day (BID) | ORAL | 11 refills | Status: DC

## 2019-05-26 MED ORDER — VIMPAT 150 MG PO TABS: 150.0000 mg | ORAL_TABLET | Freq: Two times a day (BID) | ORAL | 5 refills | Status: DC

## 2019-05-26 NOTE — Progress Notes (Signed)
NEUROLOGY CONSULTATION NOTE  Helen [name] MRN: [id number] DOB: [id number]  Referring provider: Dr. Abran Richard Primary care provider: Dr. Kevan Ny  Reason for consult:  seizures  Dear Dr Yong Channel:  Thank you for your kind referral of Helen [name] for consultation of the above symptoms. Although her history is well known to you, please allow me to reiterate it for the purpose of our medical record. She is alone in the office today. Records and images were personally reviewed where available.   HISTORY OF PRESENT ILLNESS: This is a 54 year old right-handed woman with a history of hypertension, diabetes, fibromyalgia, stroke in 1997, presenting for evaluation of seizures. 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 She has been seeing neurologist Dr. Trula Ore, and when she first started seeing him, her mother reported staring/unresponsive episodes. She reports "there was something showing up on the brain wave test," and she was started on Vimpat and Keppra at the same time in 2013 or 2014. She is not sure if the episodes quieted down because she is mostly alone at home. She has a CNA coming daily  who tells her she is not responding, last episode was a few days ago. The CNA tells here mostly everyday that something happened. She has occasional episodes of a metallic taste in her mouth. Sometimes she comes to with her teeth down on her tongue, unsure how it happened. No incontinence. She has occasional twitching in her eyes, either leg or arm. One time in church last month, both hands started trembling and she could not stop them. There was a prickly sensation at the tip of her fingers that lasted more than 15 seconds. She could understand things around her but her body felt weird. There was slight dizziness and a hot/cold  sensation. She lives with her sister and would be told that she does not answer/hear them. She has gaps in time "all the time." When driving, she does not remember passing certain places. When she laughs too hard, she can get lightheaded and things go black, then she wakes up on the floor. This happened a couple of weeks ago while she was sitting. She thinks she has seizures in her sleep, she wakes up in the morning with her teeth on her tongue. She ran out of Keppra and Vimpat 3 months ago and feels she is having more of the night events.  She has had headaches for several years with pain on the vertex and right side occurring around twice a week. No associated nausea/vomiting. There are some flickering lights. Tylenol helps. She has intermittent blurred vision that her eye doctor thinks is related to fibromyalgia. She was diagnosed with fibromyalgia in 2006, she has very sensitive skin even to light touch, in constant pain 24/7. When really bad, she needs help eating because she can barely lift her arms up. Her left arm has been in constant severe daily pain the past month, which has affected her sleep. She has been told she has carpal tunnel syndrome on the left hand 5 years ago. She was getting injections in her neck and back. She takes gabapentin once a day, and is not aware she was told to take it BID. She denies being under a lot of stress.   Epilepsy Risk Factors:  She had a normal  birth and early development.  There is no history of febrile convulsions, CNS infections such as meningitis/encephalitis, significant traumatic brain injury, neurosurgical procedures, or family history of seizures.   PAST MEDICAL HISTORY: Past Medical History:  Diagnosis Date  . Acid reflux   . Anemia   . Anxiety   . Chronic headaches   . Degenerative disc disease    lumbar  . Diabetes mellitus without complication ([place])   . Fibromyalgia   . Heart murmur   . Hypertension   . Scoliosis   . Seizures ([place])   .  Stroke [facility]) 1997    PAST SURGICAL HISTORY: Past Surgical History:  Procedure Laterality Date  . ABDOMINAL HYSTERECTOMY    . CESAREAN SECTION    . LAPAROSCOPIC UNILATERAL SALPINGO OOPHERECTOMY Right 2005    MEDICATIONS: Current Outpatient Medications on File Prior to Visit  Medication Sig Dispense Refill  . ACCU-CHEK AVIVA PLUS test strip     . acetaminophen (TYLENOL) 325 MG tablet Take 2 tablets (650 mg total) by mouth every 6 (six) hours as needed for mild pain, moderate pain or headache. 60 tablet 0  . Alcohol Swabs (ALCOHOL PREP) 70 % PADS     . ALLERGY RELIEF 10 MG tablet Take 10 mg by mouth daily.    Marland Kitchen aspirin EC 81 MG tablet Take 1 tablet (81 mg total) by mouth daily. 30 tablet 0  . ASSURE COMFORT LANCETS 30G MISC     . atorvastatin (LIPITOR) 20 MG tablet Take 20 mg by mouth daily.    . Blood Glucose Calibration (ACCU-CHEK AVIVA) SOLN     . Blood Glucose Monitoring Suppl (ACCU-CHEK AVIVA PLUS) W/DEVICE KIT     . cholecalciferol (VITAMIN D) 1000 units tablet Take 1,000 Units by mouth daily.    . diclofenac sodium (VOLTAREN) 1 % GEL Apply 1 application topically daily as needed (back pain).     . Ferrous Gluconate (IRON 27 PO) Take 1 tablet by mouth daily.    . fluticasone (FLONASE) 50 MCG/ACT nasal spray Place 2 sprays into both nostrils daily. 16 g 0  . gabapentin (NEURONTIN) 300 MG capsule Take 300 mg by mouth 2 (two) times daily.    Marland Kitchen glipiZIDE-metformin (METAGLIP) 5-500 MG tablet Take 2 tablets by mouth 2 (two) times daily as needed (CBG >150).   3  . insulin regular (NOVOLIN R,HUMULIN R) 100 units/mL injection Inject 4-6 Units into the skin daily as needed for high blood sugar (CBG >150).    Elmore Guise Devices (ADJUSTABLE LANCING DEVICE) MISC     . olmesartan-hydrochlorothiazide (BENICAR HCT) 20-12.5 MG tablet Take 1 tablet by mouth daily. 90 tablet 1  . omeprazole (PRILOSEC) 20 MG capsule Take 20 mg by mouth daily before breakfast.    . ondansetron (ZOFRAN) 4 MG tablet Take  4 mg by mouth every 8 (eight) hours as needed for nausea or vomiting.    Marland Kitchen tiZANidine (ZANAFLEX) 2 MG tablet Take 2 mg by mouth daily as needed (fibromyalgia).    . Travoprost, BAK Free, (TRAVATAN) 0.004 % SOLN ophthalmic solution 1 drop at bedtime.    . Vitamin D, Ergocalciferol, (DRISDOL) 1.25 MG (50000 UNIT) CAPS capsule Take 50,000 Units by mouth once a week.    . Lacosamide (VIMPAT) 150 MG TABS Take 150 mg by mouth 2 (two) times daily.    Marland Kitchen levETIRAcetam (KEPPRA) 500 MG tablet Take 1 tablet (500 mg total) by mouth 2 (two) times daily. (Patient not taking: Reported on 05/26/2019) 60 tablet 0  No current facility-administered medications on file prior to visit.    ALLERGIES: Allergies  Allergen Reactions  . Pregabalin Other (See Comments)    Zoned out, talking crazy  . Topiramate Other (See Comments)    REACTION: caused "inside seizures"  . Metronidazole Rash  . Milnacipran Palpitations    FAMILY HISTORY: Family History  Problem Relation Age of Onset  . Diabetes Mother   . Hyperlipidemia Mother   . Heart attack Father   . Diabetes Sister   . Breast cancer Sister   . Diabetes Brother   . Breast cancer Paternal Grandmother   . Colon cancer Neg Hx     SOCIAL HISTORY: Social History   Socioeconomic History  . Marital status: Divorced    Spouse name: Not on file  . Number of children: Not on file  . Years of education: Not on file  . Highest education level: Not on file  Occupational History  . Not on file  Tobacco Use  . Smoking status: Never Smoker  . Smokeless tobacco: Never Used  Substance and Sexual Activity  . Alcohol use: No  . Drug use: No  . Sexual activity: Not on file  Other Topics Concern  . Not on file  Social History Narrative   Right handed    One story home few steps   Lives with sister    Social Determinants of Health   Financial Resource Strain:   . Difficulty of Paying Living Expenses: Not on file  Food Insecurity:   . Worried About  Charity fundraiser in the Last Year: Not on file  . Ran Out of Food in the Last Year: Not on file  Transportation Needs:   . Lack of Transportation (Medical): Not on file  . Lack of Transportation (Non-Medical): Not on file  Physical Activity:   . Days of Exercise per Week: Not on file  . Minutes of Exercise per Session: Not on file  Stress:   . Feeling of Stress : Not on file  Social Connections:   . Frequency of Communication with Friends and Family: Not on file  . Frequency of Social Gatherings with Friends and Family: Not on file  . Attends Religious Services: Not on file  . Active Member of Clubs or Organizations: Not on file  . Attends Archivist Meetings: Not on file  . Marital Status: Not on file  Intimate Partner Violence:   . Fear of Current or Ex-Partner: Not on file  . Emotionally Abused: Not on file  . Physically Abused: Not on file  . Sexually Abused: Not on file    REVIEW OF SYSTEMS: Constitutional: No fevers, chills, or sweats, no generalized fatigue, change in appetite Eyes: No visual changes, double vision, eye pain Ear, nose and throat: No hearing loss, ear pain, nasal congestion, sore throat Cardiovascular: No chest pain, palpitations Respiratory:  No shortness of breath at rest or with exertion, wheezes GastrointestinaI: No nausea, vomiting, diarrhea, abdominal pain, fecal incontinence Genitourinary:  No dysuria, urinary retention or frequency Musculoskeletal:  + neck pain, back pain Integumentary: No rash, pruritus, skin lesions Neurological: as above Psychiatric: No depression,+ insomnia, anxiety Endocrine: No palpitations, fatigue, diaphoresis, mood swings, change in appetite, change in weight, increased thirst Hematologic/Lymphatic:  No anemia, purpura, petechiae. Allergic/Immunologic: no itchy/runny eyes, nasal congestion, recent allergic reactions, rashes  PHYSICAL EXAM: Vitals:   05/26/19 0853  BP: 122/86  Pulse: (!) 104  SpO2: 97%     General: No acute distress Head:  Normocephalic/atraumatic Skin/Extremities: No rash, no edema Neurological Exam: Mental status: alert and oriented to person, place, and time, no dysarthria or aphasia, Fund of knowledge is appropriate.  Recent and remote memory are intact.  Attention and concentration are normal.   Cranial nerves: CN I: not tested CN II: pupils equal, round and reactive to light, visual fields intact CN III, IV, VI:  full range of motion, no nystagmus, no ptosis CN V: facial sensation intact CN VII: upper and lower face symmetric CN VIII: hearing intact to conversation Bulk & Tone: normal, no fasciculations. Motor: 5/5 throughout with no pronator drift, reports left arm pain Sensation: intact to light touch, cold, pin, vibration and joint position sense.  No extinction to double simultaneous stimulation.  Romberg test negative Deep Tendon Reflexes: +2 throughout Cerebellar: no incoordination on finger to nose testing Gait: slow and cautious, no ataxia Tremor: none  IMPRESSION: This is a 54 year old right-handed woman with a history of hypertension, diabetes, fibromyalgia, stroke in 1997, presenting for evaluation of seizures. She reports a metallic taste, staring/unresponsive episodes, gaps in time, as well as waking up with near-tongue bites. She has been told her EEG in the past was abnormal, records will be requested for review. Restart Vimpat 122m BID and Levetiracetam 5014mBID. Etiology of seizures unclear, MRI brain with and without contrast and a 1-hour EEG will be ordered. If normal, a 48-hour EEG will be done to further classify seizures. She will be referred to Ortho for left shoulder pain. [place] driving laws were discussed with the patient, and she knows to stop driving after a seizure, until 6 months seizure-free. Follow-up in 3-4 months, she knows to call for any changes.   Thank you for allowing me to participate in the care of this patient. Please do not  hesitate to call for any questions or concerns.   Helen NewerM.D.  CC: Dr. DeMarlou SaDr. HuYong Channel

## 2019-05-26 NOTE — Patient Instructions (Addendum)
1. Schedule MRI brain with and without contrast  2. Schedule 1-hour EEG, then 48-hour EEG  3. Restart Keppra 500mg  twice a day and Vimpat 150mg  twice a day  4. Refer to Ortho for left shoulder pain  5. Records from Dr. will be requested for review  6. As per [place] driving laws, no driving after an episode of unresponsiveness, until 6 months event-free  7. Follow-up in 3-4 months, call for any changes.  Seizure Precautions: 1. If medication has been prescribed for you to prevent seizures, take it exactly as directed.  Do not stop taking the medicine without talking to your doctor first, even if you have not had a seizure in a long time.   2. Avoid activities in which a seizure would cause danger to yourself or to others.  Don't operate dangerous machinery, swim alone, or climb in high or dangerous places, such as on ladders, roofs, or girders.  Do not drive unless your doctor says you may.  3. If you have any warning that you may have a seizure, lay down in a safe place where you can't hurt yourself.    4.  No driving for 6 months from last seizure, as per [facility].   Please refer to the following link on the Epilepsy Foundation of America's website for more information: http://www.epilepsyfoundation.org/answerplace/Social/driving/drivingu.cfm   5.  Maintain good sleep hygiene. Avoid alcohol.  6.  Contact your doctor if you have any problems that may be related to the medicine you are taking.  7.  Call 911 and bring the patient back to the ED if:        A.  The seizure lasts longer than 5 minutes.       B.  The patient doesn't awaken shortly after the seizure  C.  The patient has new problems such as difficulty seeing, speaking or moving  D.  The patient was injured during the seizure  E.  The patient has a temperature over 102 F (39C)  F.  The patient vomited and now is having trouble breathing

## 2019-06-01 ENCOUNTER — Other Ambulatory Visit: Payer: Self-pay

## 2019-06-01 ENCOUNTER — Ambulatory Visit (INDEPENDENT_AMBULATORY_CARE_PROVIDER_SITE_OTHER): Payer: Medicare Other | Admitting: Neurology

## 2019-06-01 DIAGNOSIS — G40909 Epilepsy, unspecified, not intractable, without status epilepticus: Secondary | ICD-10-CM

## 2019-06-02 ENCOUNTER — Ambulatory Visit: Payer: Self-pay

## 2019-06-02 ENCOUNTER — Other Ambulatory Visit: Payer: Self-pay

## 2019-06-02 ENCOUNTER — Encounter: Payer: Self-pay | Admitting: Orthopaedic Surgery

## 2019-06-02 ENCOUNTER — Ambulatory Visit (INDEPENDENT_AMBULATORY_CARE_PROVIDER_SITE_OTHER): Payer: Medicare Other | Admitting: Orthopaedic Surgery

## 2019-06-02 DIAGNOSIS — M25512 Pain in left shoulder: Secondary | ICD-10-CM

## 2019-06-02 DIAGNOSIS — G8929 Other chronic pain: Secondary | ICD-10-CM | POA: Diagnosis not present

## 2019-06-02 MED ORDER — TRAMADOL HCL 50 MG PO TABS: 50.0000 mg | ORAL_TABLET | Freq: Four times a day (QID) | ORAL | 0 refills | Status: DC | PRN

## 2019-06-02 MED ORDER — NABUMETONE 750 MG PO TABS: 750.0000 mg | ORAL_TABLET | Freq: Two times a day (BID) | ORAL | 1 refills | Status: DC | PRN

## 2019-06-02 NOTE — Progress Notes (Signed)
Office Visit Note   Patient: Helen [name]           Date of Birth: [birth year]           MRN: [id number] Visit Date: 06/02/2019              Requested by: Cameron Sprang, MD [address] [place],  [place] [address] PCP: Rogers Blocker, MD   Assessment & Plan: Visit Diagnoses:  1. Chronic left shoulder pain     Plan: I went over the x-rays of her left shoulder with her and talked about her clinical exam.  I do feel that she would benefit from physical therapy on her left shoulder to hopefully decrease her pain and improve the function of the shoulder.  I am hesitant to try a steroid injection given her diabetes.  We will try some tramadol combined with Relafen for pain and inflammation.  All questions and  concerns were answered and addressed.  We will work on scheduling physical therapy for her left shoulder and we will see her back in the office in about 4 weeks to see how she is doing overall.  Follow-Up Instructions: Return in about 4 weeks (around 06/30/2019).   Orders:  Orders Placed This Encounter  Procedures  . XR Shoulder Left   Meds ordered this encounter  Medications  . traMADol (ULTRAM) 50 MG tablet    Sig: Take 1-2 tablets (50-100 mg total) by mouth every 6 (six) hours as needed.    Dispense:  40 tablet    Refill:  0  . nabumetone (RELAFEN) 750 MG tablet    Sig: Take 1 tablet (750 mg total) by mouth 2 (two) times daily as needed.    Dispense:  60 tablet    Refill:  1      Procedures: No procedures performed   Clinical Data: No additional findings.   Subjective: Chief Complaint  Patient presents with  . Left Shoulder - Pain  The patient comes in today with left shoulder pain is been hurting for about 3 months.  She has fallen a few times on to out left shoulder.  She now reports decreased range of motion and strength with the left shoulder.  It hurts with reaching behind her and with overhead.  It does hurt some posterior but also deep and  anterior with the left shoulder.  She is a diabetic and reports that her daily sugars are running around 120-130.  She cannot report her last hemoglobin A1c.  She is someone who is significantly obese.  She is very pleasant to speak to.  She does ambulate with a cane.  She denies any neck pain or numbness and tingling in her left hand.  She has never had surgery on this left shoulder before and never injured it before.  HPI  Review of Systems She currently denies any headache, chest pain, shortness of breath, fever, chills, nausea, vomiting  Objective: Vital Signs: There were no vitals taken for this visit.  Physical Exam She is alert and orient x3 and in no acute distress Ortho Exam Examination of her left shoulder does show pain with abduction past 90 degrees.  There is some weakness in the shoulder in general.  Her liftoff is positive in terms of weakness of the subscapularis tendon area. Specialty Comments:  No specialty comments available.  Imaging: XR Shoulder Left  Result Date: 06/02/2019 3 views of left shoulder show no acute findings.  The shoulder  is well located.    PMFS History: Patient Active Problem List   Diagnosis Date Noted  . HLD (hyperlipidemia) 11/05/2014  . Essential hypertension 11/05/2014  . Numbness 08/04/2014  . H/O fibromyalgia 08/04/2014  . Left upper extremity numbness   . Numbness of left lower extremity   . UTI 07/01/2008  . DIARRHEA, ACUTE 06/11/2008  . GERD 05/28/2008  . PERSISTENT DISORDER INITIATING/MAINTAINING SLEEP 04/01/2008  . STROKE 04/01/2008  . HEADACHE, CHRONIC 04/01/2008  . Morbid obesity ([facility]) 03/16/2008  . BACK PAIN 12/17/2007  . Sleep apnea 12/17/2007  . 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 FIBROMYALGIA 12/03/2007   Past Medical History:  Diagnosis Date  . Acid reflux   . Anemia   . Anxiety   . Chronic headaches   . Degenerative disc disease    lumbar  . Diabetes mellitus without complication ([facility])    . Fibromyalgia   . Heart murmur   . Hypertension   . Scoliosis   . Seizures ([facility])   . Stroke [facility]) 1997    Family History  Problem Relation Age of Onset  . Diabetes Mother   . Hyperlipidemia Mother   . Heart attack Father   . Diabetes Sister   . Breast cancer Sister   . Diabetes Brother   . Breast cancer Paternal Grandmother   . Colon cancer Neg Hx     Past Surgical History:  Procedure Laterality Date  . ABDOMINAL HYSTERECTOMY    . CESAREAN SECTION    . LAPAROSCOPIC UNILATERAL SALPINGO OOPHERECTOMY Right 2005   Social History   Occupational History  . Not on file  Tobacco Use  . Smoking status: Never Smoker  . Smokeless tobacco: Never Used  Substance and Sexual Activity  . Alcohol use: No  . Drug use: No  . Sexual activity: Not on file

## 2019-06-02 NOTE — Procedures (Signed)
ELECTROENCEPHALOGRAM REPORT  Date of Study: 06/01/2019  Patient's Name: Helen [name] MRN: [id number] Date of Birth: October 15, 1965  Referring Provider: Dr. Patrcia Dolly  Clinical History: This is a 54 year old woman with recurrent episodes of staring/not responding. EEG for classification.  Medications: VIMPAT 150 MG TABS NEURONTIN 300 MG capsule TYLENOL 325 MG tablet ALLERGY RELIEF 10 MG tablet aspirin EC 81 MG tablet LIPITOR 20 MG tablet VITAMIN D 1000 units tablet VOLTAREN 1 % GEL IRON 27 PO FLONASE 50 MCG/ACT nasal spray METAGLIP 5-500 MG tablet NOVOLIN R,HUMULIN R 100 units/mL injection BENICAR HCT 20-12.5 MG tablet PRILOSEC 20 MG capsule ZOFRAN 4 MG tablet ZANAFLEX 2 MG tablet TRAVATAN 0.004 % SOLN ophthalmic solution  Technical Summary: A multichannel digital 1-hour EEG recording measured by the international 10-20 system with electrodes applied with paste and impedances below 5000 ohms performed in our laboratory with EKG monitoring in an awake and asleep patient.  Hyperventilation was not performed. Photic stimulation was performed.  The digital EEG was referentially recorded, reformatted, and digitally filtered in a variety of bipolar and referential montages for optimal display.    Description: The patient is awake and asleep during the recording.  During maximal wakefulness, there is a symmetric, medium voltage 9.5 Hz posterior dominant rhythm that attenuates with eye opening.  The record is symmetric.  During drowsiness and sleep, there is an increase in theta slowing of the background.  Vertex waves and symmetric sleep spindles were seen.  Hyperventilation and photic stimulation did not elicit any abnormalities.  There were no epileptiform discharges or electrographic seizures seen.    EKG lead was unremarkable.  Impression: This 1-hour awake and asleep EEG is normal.    Clinical Correlation: A normal EEG does not exclude a clinical diagnosis of epilepsy.  If  further clinical questions remain, prolonged EEG may be helpful.  Clinical correlation is advised.   Patrcia Dolly, M.D.

## 2019-06-03 ENCOUNTER — Telehealth: Payer: Self-pay

## 2019-06-03 NOTE — Telephone Encounter (Signed)
Pt called with EEG results will do the 48 hour eeg as scheduled

## 2019-06-08 ENCOUNTER — Other Ambulatory Visit: Payer: Self-pay

## 2019-06-08 ENCOUNTER — Ambulatory Visit (INDEPENDENT_AMBULATORY_CARE_PROVIDER_SITE_OTHER): Payer: Medicare Other | Admitting: Neurology

## 2019-06-08 DIAGNOSIS — G40909 Epilepsy, unspecified, not intractable, without status epilepticus: Secondary | ICD-10-CM

## 2019-06-18 ENCOUNTER — Ambulatory Visit: Payer: Medicare Other | Attending: Orthopaedic Surgery | Admitting: Physical Therapy

## 2019-06-18 ENCOUNTER — Other Ambulatory Visit: Payer: Self-pay

## 2019-06-18 ENCOUNTER — Encounter: Payer: Self-pay | Admitting: Physical Therapy

## 2019-06-18 DIAGNOSIS — M25612 Stiffness of left shoulder, not elsewhere classified: Secondary | ICD-10-CM | POA: Insufficient documentation

## 2019-06-18 DIAGNOSIS — M6281 Muscle weakness (generalized): Secondary | ICD-10-CM | POA: Diagnosis present

## 2019-06-18 DIAGNOSIS — M25512 Pain in left shoulder: Secondary | ICD-10-CM | POA: Insufficient documentation

## 2019-06-18 DIAGNOSIS — G8929 Other chronic pain: Secondary | ICD-10-CM | POA: Insufficient documentation

## 2019-06-18 NOTE — Patient Instructions (Signed)
Access Code: [email or web address] URL: [email or web address] Date: 06/18/2019 Prepared by: Rosana Hoes  Exercises Supine Shoulder Press with Dowel - 3 x daily - 7 x weekly - 10 reps Supine Shoulder External Rotation AAROM with Dowel - 3 x daily - 7 x weekly - 10 reps Seated Shoulder Flexion Towel Slide at Table Top - 3 x daily - 7 x weekly - 10 reps - 5-10 seconds hold

## 2019-06-18 NOTE — Therapy (Signed)
[facility] Outpatient Rehabilitation [facility] [address] [place], [place], [address] Phone: [phone]   Fax:  [phone]  Physical Therapy Evaluation  Patient Details  Name: Helen [name] MRN: [id number] Date of Birth: 22-Apr-1965 Referring Provider (PT): Kathryne Hitch, MD   Encounter Date: 06/18/2019  PT End of Session - 06/18/19 6438    Visit Number  1    Number of Visits  16    Date for PT Re-Evaluation  08/13/19    Authorization Type  UHC MCR    PT Start Time  0825    PT Stop Time  0910    PT Time Calculation (min)  45 min    Activity Tolerance  Patient tolerated treatment well    Behavior During Therapy  [facility] for tasks assessed/performed       Past Medical History:  Diagnosis Date  . Acid reflux   . Anemia   . Anxiety   . Chronic headaches   . Degenerative disc disease    lumbar  . Diabetes mellitus without complication (HCC)   . Fibromyalgia   . Heart murmur   . Hypertension   . Scoliosis   . Seizures (HCC)   . Stroke [facility]) 1997    Past Surgical History:  Procedure Laterality Date  . ABDOMINAL HYSTERECTOMY    . CESAREAN SECTION    . LAPAROSCOPIC UNILATERAL SALPINGO OOPHERECTOMY Right 2005    There were no vitals filed for this visit.   Subjective Assessment - 06/18/19 0820    Subjective  Patient reports she has been having a lot of pain in the left shoulder. She has been falling a lot and has landed on the left shoulder. She states it constantly stays sore and when she tries to lift it up she has difficulty and more pain on the outside of the shoulder. She sleeps on the left side and she hasn't been sleeping very well. She denies any neck pain or pain shooting down her arm.    Pertinent History  DM, Fibromyalgia, HTN, stroke in 1997, BMI, seizures    Limitations  Lifting;House hold activities    How long can you sit comfortably?  No limitation    How long can you stand comfortably?  No limitation    How long can you walk  comfortably?  No limitation    Diagnostic tests  X-ray    Patient Stated Goals  Get rid of pain in left shoulder so she can reach overhead better    Currently in Pain?  Yes    Pain Score  6    9-10/10 with movement   Pain Location  Shoulder    Pain Orientation  Left    Pain Descriptors / Indicators  Sore;Heaviness    Pain Type  Chronic pain    Pain Radiating Towards  Lateral upper arm    Pain Onset  More than a month ago    Pain Frequency  Intermittent    Aggravating Factors   Moving the left arm, lying on the left arm    Pain Relieving Factors  Medication    Effect of Pain on Daily Activities  Patient feels limited in activites using her left arm         [facility] PT Assessment - 06/18/19 0001      Assessment   Medical Diagnosis  Chronic left shoulder pain    Referring Provider (PT)  Kathryne Hitch, MD    Onset Date/Surgical Date  --   3-4  months ago   Hand Dominance  Right    Next MD Visit  06/30/2019    Prior Therapy  No      Precautions   Precautions  None      Restrictions   Weight Bearing Restrictions  No      Balance Screen   Has the patient fallen in the past 6 months  Yes    How many times?  3    Has the patient had a decrease in activity level because of a fear of falling?   Yes    Is the patient reluctant to leave their home because of a fear of falling?   Yes      Home Environment   Living Environment  Private residence    Living Arrangements  Other relatives      Prior Function   Level of Independence  Independent    Vocation  On disability    Leisure  None reported      Cognition   Overall Cognitive Status  Within Functional Limits for tasks assessed      Observation/Other Assessments   Observations  Patient appears in no apparent distress    Focus on Therapeutic Outcomes (FOTO)   56% limitation      Sensation   Light Touch  Appears Intact      Posture/Postural Control   Posture Comments  Patient exhibits rounded shoulder and forward  head posture      ROM / Strength   AROM / PROM / Strength  AROM;PROM;Strength      AROM   Overall AROM Comments  Increased pain with all left shoulder movement    AROM Assessment Site  Shoulder    Right/Left Shoulder  Right;Left    Right Shoulder Flexion  160 Degrees    Right Shoulder ABduction  160 Degrees    Right Shoulder Internal Rotation  --   T10   Right Shoulder External Rotation  80 Degrees   T2   Left Shoulder Flexion  100 Degrees    Left Shoulder ABduction  70 Degrees    Left Shoulder Internal Rotation  --   PSIS   Left Shoulder External Rotation  60 Degrees   Occiput     PROM   Overall PROM Comments  Increased pain at all end ranges    PROM Assessment Site  Shoulder    Right/Left Shoulder  Right;Left    Left Shoulder Flexion  130 Degrees    Left Shoulder ABduction  100 Degrees    Left Shoulder External Rotation  80 Degrees   at 30 deg abd     Strength   Overall Strength Comments  Increased pain with all left shoulder testing    Strength Assessment Site  Shoulder    Right/Left Shoulder  Right;Left    Right Shoulder Flexion  4+/5    Right Shoulder ABduction  4+/5    Right Shoulder Internal Rotation  4+/5    Right Shoulder External Rotation  4+/5    Left Shoulder Flexion  --   Not tested   Left Shoulder ABduction  --   Not tested   Left Shoulder Internal Rotation  4/5    Left Shoulder External Rotation  4/5      Palpation   Palpation comment  Patient notes TTP grossly over left shoulder, increased around periacromial region      Special Tests   Other special tests  None performed secondary to pain level  Ambulation/Gait   Gait Comments  Patient uses SPC on right side                Objective measurements completed on examination: See above findings.      [place] Adult PT Treatment/Exercise - 06/18/19 0001      Self-Care   Self-Care  Heat/Ice Application    Heat/Ice Application  Patient instructed to use ice/heat for pain management       Exercises   Exercises  Shoulder      Shoulder Exercises: Supine   External Rotation  10 reps;AAROM    External Rotation Limitations  using cane with elbow by side    Flexion  10 reps;AAROM    Flexion Limitations  chest press using cane      Shoulder Exercises: Seated   Flexion  10 reps;AAROM    Flexion Limitations  table slide             PT Education - 06/18/19 6045    Education Details  Exam findings, POC, HEP    Person(s) Educated  Patient    Methods  Explanation;Demonstration;Tactile cues;Verbal cues;Handout    Comprehension  Verbalized understanding;Returned demonstration;Verbal cues required;Tactile cues required;Need further instruction       PT Short Term Goals - 06/18/19 0919      PT SHORT TERM GOAL #1   Title  Patient will be I with initial HEP to progress in PT    Time  4    Period  Weeks    Status  New    Target Date  07/16/19      PT SHORT TERM GOAL #2   Title  Patient will report </= 4/10 pain level at rest to improve activity level    Time  4    Period  Weeks    Status  New    Target Date  07/16/19      PT SHORT TERM GOAL #3   Title  Patient will be able to reach a shelf at shoulder height with mod difficulty    Time  4    Period  Weeks    Status  New    Target Date  07/16/19        PT Long Term Goals - 06/18/19 1239      PT LONG TERM GOAL #1   Title  Patient will be I with final HEP to maintain progress from PT.    Time  8    Period  Weeks    Status  New    Target Date  08/13/19      PT LONG TERM GOAL #2   Title  Patient will report improved functional level of </= 42% limitation on FOTO    Time  8    Period  Weeks    Status  New    Target Date  08/13/19      PT LONG TERM GOAL #3   Title  Patient will be able to reach into shelf at shoulder height with little to no difficulty    Time  8    Period  Weeks    Status  New    Target Date  08/13/19      PT LONG TERM GOAL #4   Title  Patient will be able to carry >/= 5-10  lbs with no difficulty to improve shopping    Time  8    Period  Weeks    Status  New    Target Date  08/13/19      PT LONG TERM GOAL #5   Title  Patient will be able to perform all grooming, bathing, and dressing tasks with little to no difficulty    Time  8    Period  Weeks    Status  New    Target Date  08/13/19             Plan - 06/18/19 1610    Clinical Impression Statement  Patient presents to PT with report of chronic left shoulder pain following a fall onto the left side. Currently she exhibit limited range of motion and strength deficit on left with high levels of pain while all movement and muscle testing. Its unclear exact etiology of left shoulder pain, symptoms are consistent with rotator cuff injury vs. tendinopathy vs. adhesive capsulitis. She was not able to tolerate much activity this visit which limited evaluation and exercises she was able to perform. She would benefit from continued skilled PT to progress shoulder motion and strength so she can perform reaching, dressing, and grooming tasks without limitation.    Personal Factors and Comorbidities  Past/Current Experience;Time since onset of injury/illness/exacerbation;Comorbidity 3+;Fitness    Comorbidities  DM, Fibromyalgia, HTN, stroke in 1997, BMI, seizures    Examination-Activity Limitations  Bathing;Carry;Dressing;Hygiene/Grooming;Lift;Sleep;Reach Overhead    Examination-Participation Restrictions  Cleaning;Meal Prep;Shop;Community Activity;Laundry;Yard Work    Conservation officer, historic buildings  Evolving/Moderate complexity    Clinical Decision Making  Moderate    Rehab Potential  Good    PT Frequency  2x / week    PT Duration  8 weeks    PT Treatment/Interventions  ADLs/Self Care Home Management;Cryotherapy;Electrical Stimulation;Moist Heat;Therapeutic activities;Therapeutic exercise;Neuromuscular re-education;Manual techniques;Patient/family education;Dry needling;Passive range of motion;Joint  Manipulations    PT Next Visit Plan  Assess HEP and progress PRN, modalities and/or manual for pain relief, progress AAROM using pulleys/ranger/dowel, periscapualr strengthening as able    PT Home Exercise Plan  Supine shoulder press with cane, supine shoulder ER with cane, seated table slide flexion    Consulted and Agree with Plan of Care  Patient       Patient will benefit from skilled therapeutic intervention in order to improve the following deficits and impairments:  Decreased range of motion, Postural dysfunction, Decreased strength, Decreased activity tolerance, Pain  Visit Diagnosis: Chronic left shoulder pain  Muscle weakness (generalized)  Stiffness of left shoulder, not elsewhere classified     Problem List Patient Active Problem List   Diagnosis Date Noted  . HLD (hyperlipidemia) 11/05/2014  . Essential hypertension 11/05/2014  . Numbness 08/04/2014  . H/O fibromyalgia 08/04/2014  . Left upper extremity numbness   . Numbness of left lower extremity   . UTI 07/01/2008  . DIARRHEA, ACUTE 06/11/2008  . GERD 05/28/2008  . PERSISTENT DISORDER INITIATING/MAINTAINING SLEEP 04/01/2008  . STROKE 04/01/2008  . HEADACHE, CHRONIC 04/01/2008  . Morbid obesity (HCC) 03/16/2008  . BACK PAIN 12/17/2007  . Sleep apnea 12/17/2007  . 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 FIBROMYALGIA 12/03/2007    Rosana Hoes, PT, DPT, LAT, ATC 06/18/19  12:52 PM Phone: [phone] Fax: [phone]   [facility] Outpatient Rehabilitation [facility] [address] [place], [place], [address] Phone: [phone]   Fax:  [phone]  Name: Helen [name] MRN: [id number] Date of Birth: [birth year]

## 2019-06-29 ENCOUNTER — Ambulatory Visit: Payer: Medicare Other | Admitting: Physical Therapy

## 2019-06-30 ENCOUNTER — Ambulatory Visit: Payer: Medicare Other | Admitting: Orthopaedic Surgery

## 2019-07-01 ENCOUNTER — Other Ambulatory Visit: Payer: Self-pay

## 2019-07-01 ENCOUNTER — Ambulatory Visit
Admission: RE | Admit: 2019-07-01 | Discharge: 2019-07-01 | Disposition: A | Discharge status: Home / Self Care | Payer: Medicare Other | Source: Ambulatory Visit | Attending: Neurology | Admitting: Neurology

## 2019-07-01 DIAGNOSIS — G40909 Epilepsy, unspecified, not intractable, without status epilepticus: Secondary | ICD-10-CM

## 2019-07-01 MED ORDER — GADOBENATE DIMEGLUMINE 529 MG/ML IV SOLN
20.0000 mL | Freq: Once | INTRAVENOUS | Status: AC | PRN
  Administered 2019-07-01: 20 mL via INTRAVENOUS

## 2019-07-03 ENCOUNTER — Ambulatory Visit: Payer: Medicare Other | Admitting: Physical Therapy

## 2019-07-03 ENCOUNTER — Telehealth: Payer: Self-pay

## 2019-07-03 NOTE — Procedures (Addendum)
ELECTROENCEPHALOGRAM REPORT  Dates of Recording: 06/08/2019 8:02AM to 06/10/2019 8:18AM  Patient's Name: Helen [name] MRN: [id number] Date of Birth: [birth year]  Referring Provider: Dr. Ellouise Newer  Procedure: 48-hour ambulatory video EEG  History: This is a 54 year old woman with a history of seizures with continued report of episodes of not responding, metallic taste, twitching. EEG for classification.  Medications:  VIMPAT 150 MG TABS NEURONTIN 300 MG capsule TYLENOL 325 MG tablet ALLERGY RELIEF 10 MG tablet aspirin EC 81 MG tablet LIPITOR 20 MG tablet VITAMIN D 1000 units tablet VOLTAREN 1 % GEL IRON 27 PO FLONASE 50 MCG/ACT nasal spray METAGLIP 5-500 MG tablet NOVOLIN R,HUMULIN R 100 units/mL injection BENICAR HCT 20-12.5 MG tablet PRILOSEC 20 MG capsule ZOFRAN 4 MG tablet ZANAFLEX 2 MG tablet TRAVATAN 0.004 % SOLN ophthalmic solution  Technical Summary: This is a 48-hour multichannel digital video EEG recording measured by the international 10-20 system with electrodes applied with paste and impedances below 5000 ohms performed as portable with EKG monitoring.  The digital EEG was referentially recorded, reformatted, and digitally filtered in a variety of bipolar and referential montages for optimal display.    DESCRIPTION OF RECORDING: During maximal wakefulness, the background activity consisted of a symmetric 11 Hz posterior dominant rhythm which was reactive to eye opening.  There were no epileptiform discharges or focal slowing seen in wakefulness.  During the recording, the patient progresses through wakefulness, drowsiness, and Stage 2 sleep.  Again, there were no epileptiform discharges seen.  Events: Patient did not submit diary. There were multiple push button events with no EEG changes seen.  There were no electrographic seizures seen.  EKG lead was unremarkable.  IMPRESSION: This 48-hour ambulatory video EEG study is normal.    CLINICAL CORRELATION: A  normal EEG does not exclude a clinical diagnosis of epilepsy. Patient did not submit symptom diary to comment on correlation with EEG, however push button events did not show any epileptiform correlate.  If further clinical questions remain, inpatient video EEG monitoring may be helpful.   Ellouise Newer, M.D.   ADDENDUM 07/06/2019:  Patient submitted symptom diary:  On 3/15 at 1015 hours, she was awoken from sleep to teeth in tongue. Electrographically, there were no EEG or EKG changes seen.  On 3/15 at 1110 hours, she had a sudden headache. Electrographically, there were no EEG or EKG changes seen.  On 3/15 at 1404 hours, she has trembling, pressure on left side of head in back. Electrographically, there were no EEG or EKG changes seen.  On 3/15 at 1809 hours, she has shooting pain in left calf. Electrographically, there were no EEG or EKG changes seen.  On 3/15 at 1846 hours, she had something similar to hiccups but not hiccups. Electrographically, there were no EEG or EKG changes seen.  On 3/15 at 2022 hours, she came out of a stare. Electrographically, there were no EEG or EKG changes seen.  On 3/16 at 1416 hours, she has pain in left leg. Electrographically, there were no EEG or EKG changes seen.  On 3/16 at 1442 hours, she has something like a hiccup but not hiccups. Electrographically, there were no EEG or EKG changes seen.  On 3/16 at 1454 hours, she has pressure in head, dizziness. Electrographically, there were no EEG or EKG changes seen.  On 3/16 at 2102 hours, she has sharp pain on left side. Electrographically, there were no EEG or EKG changes seen.  On 3/16 at 2313 hours, she has throbbing pain  on right side back of head. Electrographically, there were no EEG or EKG changes seen.  On 3/17 at 0042 hours, she has throbbing pain on right side of head. Electrographically, there were no EEG or EKG changes seen.  Impression: This 48-hour EEG is normal. Episodes of "coming out  of stare," pain, hiccup-like sensation, did not show epileptiform correlate.

## 2019-07-03 NOTE — Telephone Encounter (Signed)
-----   Message from Van Clines, MD sent at 07/03/2019  9:08 AM EDT ----- Pls let patient know MRI brain normal, no evidence of tumor, stroke, bleed. THanks

## 2019-07-03 NOTE — Telephone Encounter (Signed)
Patient aware of MRI results  

## 2019-07-06 ENCOUNTER — Other Ambulatory Visit: Payer: Self-pay

## 2019-07-06 ENCOUNTER — Ambulatory Visit: Payer: Medicare Other | Attending: Orthopaedic Surgery | Admitting: Physical Therapy

## 2019-07-06 ENCOUNTER — Encounter: Payer: Self-pay | Admitting: Physical Therapy

## 2019-07-06 DIAGNOSIS — M25612 Stiffness of left shoulder, not elsewhere classified: Secondary | ICD-10-CM | POA: Diagnosis present

## 2019-07-06 DIAGNOSIS — M25512 Pain in left shoulder: Secondary | ICD-10-CM | POA: Diagnosis not present

## 2019-07-06 DIAGNOSIS — M6281 Muscle weakness (generalized): Secondary | ICD-10-CM | POA: Insufficient documentation

## 2019-07-06 DIAGNOSIS — G8929 Other chronic pain: Secondary | ICD-10-CM | POA: Diagnosis present

## 2019-07-06 NOTE — Therapy (Signed)
[place], [place], [address] Phone: [phone]   Fax:  [phone]  Physical Therapy Treatment  Patient Details  Name: Helen [name] MRN: [id number] Date of Birth: 1966-02-05 Referring Provider (PT): Mcarthur Rossetti, MD   Encounter Date: 07/06/2019  PT End of Session - 07/06/19 0959    Visit Number  2    Number of Visits  16    Date for PT Re-Evaluation  08/13/19    Authorization Type  UHC MCR    PT Start Time  0958    PT Stop Time  1050    PT Time Calculation (min)  52 min    Activity Tolerance  Patient tolerated treatment well    Behavior During Therapy  [facility] for tasks assessed/performed       Past Medical History:  Diagnosis Date  . Acid reflux   . Anemia   . Anxiety   . Chronic headaches   . Degenerative disc disease    lumbar  . Diabetes mellitus without complication ([place])   . Fibromyalgia   . Heart murmur   . Hypertension   . Scoliosis   . Seizures ([place])   . Stroke [facility]) 1997    Past Surgical History:  Procedure Laterality Date  . ABDOMINAL HYSTERECTOMY    . CESAREAN SECTION    . LAPAROSCOPIC UNILATERAL SALPINGO OOPHERECTOMY Right 2005    There were no vitals filed for this visit.  Subjective Assessment - 07/06/19 1002    Subjective  Patient states shoulder is doing pretty good. She continues to have sleep disturbances when she sleeps on the left side. The exercises are going well.    Patient Stated Goals  Get rid of pain in left shoulder so she can reach overhead better    Currently in Pain?  Yes    Pain Score  8     Pain Location  Shoulder    Pain Orientation  Left    Pain Descriptors / Indicators  Sore    Pain Type  Chronic pain    Pain Onset  More than a month ago    Pain Frequency  Constant    Aggravating Factors   Moving left shoulder, lying on left side         [facility] PT Assessment - 07/06/19 0001      PROM   Left Shoulder Flexion  130 Degrees                    [facility] Adult PT Treatment/Exercise - 07/06/19 0001      Exercises   Exercises  Shoulder      Shoulder Exercises: Supine   External Rotation  10 reps;AAROM    External Rotation Limitations  using cane    Flexion  10 reps;AAROM   4 sets   Flexion Limitations  chest press using cane (2 sets), straight arm overhead with cane (2 sets)      Shoulder Exercises: Standing   Flexion  AAROM;10 reps   2 sets   Flexion Limitations  swiss ball forward roll on wall    Row  20 reps   2 sets   Theraband Level (Shoulder Row)  Level 1 (Yellow)      Shoulder Exercises: Pulleys   Flexion  3 minutes    Scaption  2 minutes      Shoulder Exercises: ROM/Strengthening   UBE (Upper Arm Bike)  L1 x 4 min (fwd/bwd)  Modalities   Modalities  Electrical Stimulation;Moist Heat      Moist Heat Therapy   Number Minutes Moist Heat  10 Minutes    Moist Heat Location  Shoulder      Electrical Stimulation   Electrical Stimulation Location  Left shoulder    Electrical Stimulation Action  IFC 80-150 x 10 minutes    Electrical Stimulation Parameters  Patient tolerance    Electrical Stimulation Goals  Pain             PT Education - 07/06/19 0958    Education Details  HEP    Person(s) Educated  Patient    Methods  Explanation;Demonstration;Tactile cues;Verbal cues;Handout    Comprehension  Verbalized understanding;Returned demonstration;Verbal cues required;Tactile cues required;Need further instruction       PT Short Term Goals - 06/18/19 0919      PT SHORT TERM GOAL #1   Title  Patient will be I with initial HEP to progress in PT    Time  4    Period  Weeks    Status  New    Target Date  07/16/19      PT SHORT TERM GOAL #2   Title  Patient will report </= 4/10 pain level at rest to improve activity level    Time  4    Period  Weeks    Status  New    Target Date  07/16/19      PT SHORT TERM GOAL #3   Title  Patient will be able to reach a shelf at  shoulder height with mod difficulty    Time  4    Period  Weeks    Status  New    Target Date  07/16/19        PT Long Term Goals - 06/18/19 1239      PT LONG TERM GOAL #1   Title  Patient will be I with final HEP to maintain progress from PT.    Time  8    Period  Weeks    Status  New    Target Date  08/13/19      PT LONG TERM GOAL #2   Title  Patient will report improved functional level of </= 42% limitation on FOTO    Time  8    Period  Weeks    Status  New    Target Date  08/13/19      PT LONG TERM GOAL #3   Title  Patient will be able to reach into shelf at shoulder height with little to no difficulty    Time  8    Period  Weeks    Status  New    Target Date  08/13/19      PT LONG TERM GOAL #4   Title  Patient will be able to carry >/= 5-10 lbs with no difficulty to improve shopping    Time  8    Period  Weeks    Status  New    Target Date  08/13/19      PT LONG TERM GOAL #5   Title  Patient will be able to perform all grooming, bathing, and dressing tasks with little to no difficulty    Time  8    Period  Weeks    Status  New    Target Date  08/13/19            Plan - 07/06/19 0959    Clinical Impression  Statement  Patient tolerated therapy well with no adverse effects. Use of e-stim and heat to reduce pain and guarding of left shoulder. AAROM progressed this visit with good tolerance and HEP progress as well. She would benefit from continued skilled PT to progress shoulder motion and strength so she can perform reaching, dressing, and grooming tasks without limitation.    PT Treatment/Interventions  ADLs/Self Care Home Management;Cryotherapy;Electrical Stimulation;Moist Heat;Therapeutic activities;Therapeutic exercise;Neuromuscular re-education;Manual techniques;Patient/family education;Dry needling;Passive range of motion;Joint Manipulations    PT Next Visit Plan  Assess HEP and progress PRN, modalities and/or manual for pain relief, progress AAROM  using pulleys/ranger/dowel, periscapualr strengthening as able    PT Home Exercise Plan  [id number]: Supine shoulder press with cane, supine shoulder flexion with cane, supine shoulder ER with cane, seated table slide flexion, seated row with yellow    Consulted and Agree with Plan of Care  Patient       Patient will benefit from skilled therapeutic intervention in order to improve the following deficits and impairments:  Decreased range of motion, Postural dysfunction, Decreased strength, Decreased activity tolerance, Pain  Visit Diagnosis: Chronic left shoulder pain  Muscle weakness (generalized)  Stiffness of left shoulder, not elsewhere classified     Problem List Patient Active Problem List   Diagnosis Date Noted  . HLD (hyperlipidemia) 11/05/2014  . Essential hypertension 11/05/2014  . Numbness 08/04/2014  . H/O fibromyalgia 08/04/2014  . Left upper extremity numbness   . Numbness of left lower extremity   . UTI 07/01/2008  . DIARRHEA, ACUTE 06/11/2008  . GERD 05/28/2008  . PERSISTENT DISORDER INITIATING/MAINTAINING SLEEP 04/01/2008  . STROKE 04/01/2008  . HEADACHE, CHRONIC 04/01/2008  . Morbid obesity (HCC) 03/16/2008  . BACK PAIN 12/17/2007  . Sleep apnea 12/17/2007  . 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 FIBROMYALGIA 12/03/2007    Rosana Hoes, PT, DPT, LAT, ATC 07/06/19  10:56 AM Phone: [phone] Fax: [phone]   [facility] Outpatient Rehabilitation [facility] [address] [place], [place], [address] Phone: [phone]   Fax:  [phone]  Name: Helen [name] MRN: [id number] Date of Birth: 1965-06-29

## 2019-07-06 NOTE — Patient Instructions (Signed)
Access Code: [email or web address] URL: [email or web address] Date: 07/06/2019 Prepared by: Rosana Hoes  Exercises Supine Shoulder Press with Dowel - 3 x daily - 7 x weekly - 10 reps - 2 sets Supine Shoulder Flexion Extension AAROM with Dowel - 3 x daily - 7 x weekly - 2 sets - 10 reps Supine Shoulder External Rotation AAROM with Dowel - 3 x daily - 7 x weekly - 10 reps - 2 sets Seated Shoulder Flexion Towel Slide at Table Top - 3 x daily - 7 x weekly - 10 reps - 5-10 seconds hold Seated Shoulder Row with Anchored Resistance - 2 x daily - 7 x weekly - 2 sets - 15 reps

## 2019-07-08 ENCOUNTER — Other Ambulatory Visit: Payer: Self-pay | Admitting: Orthopaedic Surgery

## 2019-07-09 NOTE — Telephone Encounter (Signed)
Please advise 

## 2019-07-10 ENCOUNTER — Encounter: Payer: Self-pay | Admitting: Physical Therapy

## 2019-07-10 ENCOUNTER — Other Ambulatory Visit: Payer: Self-pay

## 2019-07-10 ENCOUNTER — Ambulatory Visit: Payer: Medicare Other | Attending: Internal Medicine

## 2019-07-10 ENCOUNTER — Ambulatory Visit: Payer: Medicare Other | Admitting: Physical Therapy

## 2019-07-10 DIAGNOSIS — M25512 Pain in left shoulder: Secondary | ICD-10-CM | POA: Diagnosis not present

## 2019-07-10 DIAGNOSIS — M6281 Muscle weakness (generalized): Secondary | ICD-10-CM

## 2019-07-10 DIAGNOSIS — G8929 Other chronic pain: Secondary | ICD-10-CM

## 2019-07-10 DIAGNOSIS — M25612 Stiffness of left shoulder, not elsewhere classified: Secondary | ICD-10-CM

## 2019-07-10 DIAGNOSIS — Z23 Encounter for immunization: Secondary | ICD-10-CM

## 2019-07-10 NOTE — Patient Instructions (Signed)
Access Code: [email or web address] URL: [email or web address] Date: 07/10/2019 Prepared by: Rosana Hoes  Exercises Supine Shoulder Flexion Extension AAROM with Dowel - 3 x daily - 7 x weekly - 2 sets - 10 reps Supine Shoulder External Rotation AAROM with Dowel - 3 x daily - 7 x weekly - 10 reps - 2 sets Standing shoulder flexion wall slides - 3 x daily - 7 x weekly - 15 reps Seated Shoulder Row with Anchored Resistance - 2 x daily - 7 x weekly - 2 sets - 15 reps Shoulder External Rotation and Scapular Retraction with Resistance - 2 x daily - 7 x weekly - 2 sets - 10 reps Gentle Upper Trap Stretch - 2 x daily - 7 x weekly - 2 reps - 20 seconds hold Gentle Levator Scapulae Stretch - 2 x daily - 7 x weekly - 2 reps - 20 seconds hold

## 2019-07-10 NOTE — Therapy (Signed)
[facility] Outpatient Rehabilitation [facility] [address] [place], [place], [address] Phone: [phone]   Fax:  [phone]  Physical Therapy Treatment  Patient Details  Name: Helen [name] MRN: [id number] Date of Birth: 04-15-1965 Referring Provider (PT): Kathryne Hitch, MD   Encounter Date: 07/10/2019  PT End of Session - 07/10/19 0907    Visit Number  3    Number of Visits  16    Date for PT Re-Evaluation  08/13/19    Authorization Type  UHC MCR    PT Start Time  0905    PT Stop Time  0955    PT Time Calculation (min)  50 min    Activity Tolerance  Patient tolerated treatment well    Behavior During Therapy  [facility] for tasks assessed/performed       Past Medical History:  Diagnosis Date  . Acid reflux   . Anemia   . Anxiety   . Chronic headaches   . Degenerative disc disease    lumbar  . Diabetes mellitus without complication (HCC)   . Fibromyalgia   . Heart murmur   . Hypertension   . Scoliosis   . Seizures (HCC)   . Stroke [facility]) 1997    Past Surgical History:  Procedure Laterality Date  . ABDOMINAL HYSTERECTOMY    . CESAREAN SECTION    . LAPAROSCOPIC UNILATERAL SALPINGO OOPHERECTOMY Right 2005    There were no vitals filed for this visit.  Subjective Assessment - 07/10/19 0907    Subjective  Patient reports she is doing well and feels she is improving. Today her neck and shoulder are bother her because she was sitting on a bench without a back rest yesterday which cause increased tghtness and pain.    Patient Stated Goals  Get rid of pain in left shoulder so she can reach overhead better    Currently in Pain?  Yes    Pain Score  7     Pain Location  Shoulder    Pain Orientation  Left    Pain Descriptors / Indicators  Sore    Pain Type  Chronic pain    Pain Radiating Towards  left and right upper trap    Pain Onset  More than a month ago    Pain Frequency  Constant         [facility] PT Assessment - 07/10/19 0001      AROM   Right Shoulder Flexion  160 Degrees    Left Shoulder Flexion  150 Degrees                   [facility] Adult PT Treatment/Exercise - 07/10/19 0001      Exercises   Exercises  Shoulder      Shoulder Exercises: Supine   External Rotation  10 reps;AAROM    External Rotation Limitations  using cane    Flexion  10 reps;AAROM   4 sets   Flexion Limitations  chest press using cane (2 sets), straight arm overhead with cane (2 sets)      Shoulder Exercises: Seated   External Rotation  10 reps   2 sets   Theraband Level (Shoulder External Rotation)  Level 1 (Yellow)    External Rotation Limitations  no money      Shoulder Exercises: Standing   Flexion  AAROM;10 reps   2 sets   Flexion Limitations  swiss ball forward roll on wall    Row  20 reps  2 sets   Theraband Level (Shoulder Row)  Level 1 (Yellow)      Shoulder Exercises: Pulleys   Flexion  3 minutes    Scaption  3 minutes      Shoulder Exercises: ROM/Strengthening   UBE (Upper Arm Bike)  L1 x 4 min (fwd/bwd)      Shoulder Exercises: Stretch   Wall Stretch - Flexion  5 reps;10 seconds    Other Shoulder Stretches  Upper trap and levator scap stretch x20 sec each      Modalities   Modalities  Electrical Stimulation;Moist Heat      Moist Heat Therapy   Number Minutes Moist Heat  10 Minutes    Moist Heat Location  Shoulder   in combination with e-stim     Electrical Stimulation   Electrical Stimulation Location  Left shoulder   upper trap   Electrical Stimulation Action  IFC 80-150 x 10 minutes    Electrical Stimulation Parameters  Patient tolerance    Electrical Stimulation Goals  Tone;Pain             PT Education - 07/10/19 0907    Education Details  HEP update    Person(s) Educated  Patient    Methods  Explanation;Demonstration;Verbal cues;Tactile cues;Handout    Comprehension  Verbalized understanding;Returned demonstration;Verbal cues required;Tactile cues required;Need further  instruction       PT Short Term Goals - 06/18/19 0919      PT SHORT TERM GOAL #1   Title  Patient will be I with initial HEP to progress in PT    Time  4    Period  Weeks    Status  New    Target Date  07/16/19      PT SHORT TERM GOAL #2   Title  Patient will report </= 4/10 pain level at rest to improve activity level    Time  4    Period  Weeks    Status  New    Target Date  07/16/19      PT SHORT TERM GOAL #3   Title  Patient will be able to reach a shelf at shoulder height with mod difficulty    Time  4    Period  Weeks    Status  New    Target Date  07/16/19        PT Long Term Goals - 06/18/19 1239      PT LONG TERM GOAL #1   Title  Patient will be I with final HEP to maintain progress from PT.    Time  8    Period  Weeks    Status  New    Target Date  08/13/19      PT LONG TERM GOAL #2   Title  Patient will report improved functional level of </= 42% limitation on FOTO    Time  8    Period  Weeks    Status  New    Target Date  08/13/19      PT LONG TERM GOAL #3   Title  Patient will be able to reach into shelf at shoulder height with little to no difficulty    Time  8    Period  Weeks    Status  New    Target Date  08/13/19      PT LONG TERM GOAL #4   Title  Patient will be able to carry >/= 5-10 lbs with no difficulty to improve shopping  Time  8    Period  Weeks    Status  New    Target Date  08/13/19      PT LONG TERM GOAL #5   Title  Patient will be able to perform all grooming, bathing, and dressing tasks with little to no difficulty    Time  8    Period  Weeks    Status  New    Target Date  08/13/19            Plan - 07/10/19 0908    Clinical Impression Statement  Patient tolerated therapy well with no adverse effects. She is continuing to progress with her overhead range of motion and tolerating greater levels of activity and exercise with left shoulder. Use of e-stim and heat prior to exercise due to patient report of neck  stiffness and pain level, reported improvement following modalities. HEP progress and added new neck stretches. She would benefit from continued skilled PT to progress shoulder motion and strength so she can perform reaching, dressing, and grooming tasks without limitation.    PT Treatment/Interventions  ADLs/Self Care Home Management;Cryotherapy;Electrical Stimulation;Moist Heat;Therapeutic activities;Therapeutic exercise;Neuromuscular re-education;Manual techniques;Patient/family education;Dry needling;Passive range of motion;Joint Manipulations    PT Next Visit Plan  Assess HEP and progress PRN, modalities and/or manual for pain relief, progress AAROM using pulleys/ranger/dowel, periscapualr strengthening as able    PT Home Exercise Plan  [id number]: Supine shoulder flexion with cane, supine shoulder ER with cane, standing wall slide flexion, seated row with yellow, seated ER with yellow, upper trap and levator stretch    Consulted and Agree with Plan of Care  Patient       Patient will benefit from skilled therapeutic intervention in order to improve the following deficits and impairments:  Decreased range of motion, Postural dysfunction, Decreased strength, Decreased activity tolerance, Pain  Visit Diagnosis: Chronic left shoulder pain  Muscle weakness (generalized)  Stiffness of left shoulder, not elsewhere classified     Problem List Patient Active Problem List   Diagnosis Date Noted  . HLD (hyperlipidemia) 11/05/2014  . Essential hypertension 11/05/2014  . Numbness 08/04/2014  . H/O fibromyalgia 08/04/2014  . Left upper extremity numbness   . Numbness of left lower extremity   . UTI 07/01/2008  . DIARRHEA, ACUTE 06/11/2008  . GERD 05/28/2008  . PERSISTENT DISORDER INITIATING/MAINTAINING SLEEP 04/01/2008  . STROKE 04/01/2008  . HEADACHE, CHRONIC 04/01/2008  . Morbid obesity (HCC) 03/16/2008  . BACK PAIN 12/17/2007  . Sleep apnea 12/17/2007  . 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 FIBROMYALGIA 12/03/2007    Rosana Hoes, PT, DPT, LAT, ATC 07/10/19  9:55 AM Phone: [phone] Fax: [phone]   [facility] Outpatient Rehabilitation [facility] [address] [place], [place], [address] Phone: [phone]   Fax:  [phone]  Name: Helen [name] MRN: [id number] Date of Birth: Jan 31, 1966

## 2019-07-10 NOTE — Progress Notes (Signed)
   Covid-19 Vaccination Clinic  Name:  Helen [name]    MRN: [id number] DOB: Jul 31, 1965  07/10/2019  [name] was observed post Covid-19 immunization for 15 minutes without incident. She was provided with Vaccine Information Sheet and instruction to access the V-Safe system.   [name] was instructed to call 911 with any severe reactions post vaccine: Marland Kitchen Difficulty breathing  . Swelling of face and throat  . A fast heartbeat  . A bad rash all over body  . Dizziness and weakness   Immunizations Administered    Name Date Dose VIS Date Route   Pfizer COVID-19 Vaccine 07/10/2019 10:52 AM 0.3 mL 03/06/2019 Intramuscular   Manufacturer: [employer], [employer]   Lot: [id number]   NDC: [address]-4

## 2019-07-14 ENCOUNTER — Encounter: Payer: Self-pay | Admitting: Physical Therapy

## 2019-07-14 ENCOUNTER — Ambulatory Visit: Payer: Medicare Other | Admitting: Physical Therapy

## 2019-07-14 ENCOUNTER — Other Ambulatory Visit: Payer: Self-pay

## 2019-07-14 DIAGNOSIS — M6281 Muscle weakness (generalized): Secondary | ICD-10-CM

## 2019-07-14 DIAGNOSIS — G8929 Other chronic pain: Secondary | ICD-10-CM

## 2019-07-14 DIAGNOSIS — M25612 Stiffness of left shoulder, not elsewhere classified: Secondary | ICD-10-CM

## 2019-07-14 DIAGNOSIS — M25512 Pain in left shoulder: Secondary | ICD-10-CM | POA: Diagnosis not present

## 2019-07-14 NOTE — Therapy (Signed)
[place], [place], [address] Phone: [phone]   Fax:  [phone]  Physical Therapy Treatment  Patient Details  Name: Helen [name] MRN: [id number] Date of Birth: 08/16/1965 Referring Provider (PT): Mcarthur Rossetti, MD   Encounter Date: 07/14/2019  PT End of Session - 07/14/19 0826    Visit Number  4    Number of Visits  16    Date for PT Re-Evaluation  08/13/19    Authorization Type  UHC MCR    PT Start Time  0819    PT Stop Time  0910    PT Time Calculation (min)  51 min    Activity Tolerance  Patient tolerated treatment well    Behavior During Therapy  [facility] for tasks assessed/performed       Past Medical History:  Diagnosis Date  . Acid reflux   . Anemia   . Anxiety   . Chronic headaches   . Degenerative disc disease    lumbar  . Diabetes mellitus without complication ([place])   . Fibromyalgia   . Heart murmur   . Hypertension   . Scoliosis   . Seizures ([place])   . Stroke [facility]) 1997    Past Surgical History:  Procedure Laterality Date  . ABDOMINAL HYSTERECTOMY    . CESAREAN SECTION    . LAPAROSCOPIC UNILATERAL SALPINGO OOPHERECTOMY Right 2005    There were no vitals filed for this visit.  Subjective Assessment - 07/14/19 0824    Subjective  Patient reports she got a COVID vaccine in the left shoulder and her arm was sore. Otherwise she is doing well. She feels her left shoulder is getting stronger.    Patient Stated Goals  Get rid of pain in left shoulder so she can reach overhead better    Currently in Pain?  Yes    Pain Score  5     Pain Location  Shoulder    Pain Orientation  Left    Pain Descriptors / Indicators  Sore    Pain Type  Chronic pain    Pain Onset  More than a month ago    Pain Frequency  Constant         [facility] PT Assessment - 07/14/19 0001      AROM   Right Shoulder Flexion  160 Degrees    Left Shoulder Flexion  155 Degrees      Strength   Left  Shoulder Flexion  4/5    Left Shoulder ABduction  4/5                   [facility] Adult PT Treatment/Exercise - 07/14/19 0001      Exercises   Exercises  Shoulder      Shoulder Exercises: Supine   External Rotation  --    External Rotation Limitations  --    Flexion  --    Flexion Limitations  --      Shoulder Exercises: Seated   External Rotation  10 reps   2 sets   Theraband Level (Shoulder External Rotation)  Level 1 (Yellow)    External Rotation Limitations  no money      Shoulder Exercises: Standing   Flexion  10 reps   2 sets   Shoulder Flexion Weight (lbs)  1    Flexion Limitations  scaption    Extension  20 reps   2 sets   Theraband Level (Shoulder Extension)  Level 1 (Yellow)  Row  20 reps   2 sets   Theraband Level (Shoulder Row)  Level 1 (Yellow)      Shoulder Exercises: Pulleys   Flexion  3 minutes    Scaption  2 minutes      Shoulder Exercises: ROM/Strengthening   UBE (Upper Arm Bike)  L1 x 4 min (fwd/bwd)      Shoulder Exercises: Stretch   Wall Stretch - Flexion  5 reps;10 seconds    Wall Stretch - Flexion Limitations  wall slide    Other Shoulder Stretches  Upper trap and levator scap stretch x20 sec each      Modalities   Modalities  Electrical Stimulation;Moist Heat   prior to exercises     Moist Heat Therapy   Number Minutes Moist Heat  10 Minutes    Moist Heat Location  Shoulder   in combination with e-stim     Electrical Stimulation   Electrical Stimulation Location  Left shoulder   upper trap   Electrical Stimulation Action  IFC 80-150 x 10 minutes    Electrical Stimulation Parameters  Patient tolerance for intensity    Electrical Stimulation Goals  Tone;Pain             PT Education - 07/14/19 0826    Education Details  HEP    Person(s) Educated  Patient    Methods  Explanation;Demonstration;Verbal cues    Comprehension  Verbalized understanding;Returned demonstration;Verbal cues required;Need further instruction        PT Short Term Goals - 07/14/19 0902      PT SHORT TERM GOAL #1   Title  Patient will be I with initial HEP to progress in PT    Time  4    Period  Weeks    Status  On-going    Target Date  07/16/19      PT SHORT TERM GOAL #2   Title  Patient will report </= 4/10 pain level at rest to improve activity level    Baseline  Patient reports pain level 5/10    Time  4    Period  Weeks    Status  On-going    Target Date  07/16/19      PT SHORT TERM GOAL #3   Title  Patient will be able to reach a shelf at shoulder height with mod difficulty    Time  4    Period  Weeks    Status  Achieved    Target Date  07/16/19        PT Long Term Goals - 06/18/19 1239      PT LONG TERM GOAL #1   Title  Patient will be I with final HEP to maintain progress from PT.    Time  8    Period  Weeks    Status  New    Target Date  08/13/19      PT LONG TERM GOAL #2   Title  Patient will report improved functional level of </= 42% limitation on FOTO    Time  8    Period  Weeks    Status  New    Target Date  08/13/19      PT LONG TERM GOAL #3   Title  Patient will be able to reach into shelf at shoulder height with little to no difficulty    Time  8    Period  Weeks    Status  New    Target Date  08/13/19  PT LONG TERM GOAL #4   Title  Patient will be able to carry >/= 5-10 lbs with no difficulty to improve shopping    Time  8    Period  Weeks    Status  New    Target Date  08/13/19      PT LONG TERM GOAL #5   Title  Patient will be able to perform all grooming, bathing, and dressing tasks with little to no difficulty    Time  8    Period  Weeks    Status  New    Target Date  08/13/19            Plan - 07/14/19 0827    Clinical Impression Statement  Patient tolerated therapy well with no adverse effects. Exhibits increased overhead AROM and was able tolerate muscle testing with good strength this visit. She continues to progress well with her exercises and is  tolerating greater levels of activity and resistance without increase pain level. She does require cueing for proper form to reduce shoulder shrug and improve posture. She would benefit from continued skilled PT to progress shoulder motion and strength so she can perform reaching, dressing, and grooming tasks without limitation.    PT Treatment/Interventions  ADLs/Self Care Home Management;Cryotherapy;Electrical Stimulation;Moist Heat;Therapeutic activities;Therapeutic exercise;Neuromuscular re-education;Manual techniques;Patient/family education;Dry needling;Passive range of motion;Joint Manipulations    PT Next Visit Plan  Assess HEP and progress PRN, modalities and/or manual for pain relief, progress AAROM using pulleys/ranger/dowel, periscapualr strengthening as able    PT Home Exercise Plan  [id number]: Supine shoulder flexion with cane, supine shoulder ER with cane, standing wall slide flexion, seated row with yellow, seated ER with yellow, upper trap and levator stretch    Consulted and Agree with Plan of Care  Patient       Patient will benefit from skilled therapeutic intervention in order to improve the following deficits and impairments:  Decreased range of motion, Postural dysfunction, Decreased strength, Decreased activity tolerance, Pain  Visit Diagnosis: Chronic left shoulder pain  Muscle weakness (generalized)  Stiffness of left shoulder, not elsewhere classified     Problem List Patient Active Problem List   Diagnosis Date Noted  . HLD (hyperlipidemia) 11/05/2014  . Essential hypertension 11/05/2014  . Numbness 08/04/2014  . H/O fibromyalgia 08/04/2014  . Left upper extremity numbness   . Numbness of left lower extremity   . UTI 07/01/2008  . DIARRHEA, ACUTE 06/11/2008  . GERD 05/28/2008  . PERSISTENT DISORDER INITIATING/MAINTAINING SLEEP 04/01/2008  . STROKE 04/01/2008  . HEADACHE, CHRONIC 04/01/2008  . Morbid obesity (HCC) 03/16/2008  . BACK PAIN 12/17/2007  .  Sleep apnea 12/17/2007  . 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 FIBROMYALGIA 12/03/2007    Rosana Hoes, PT, DPT, LAT, ATC 07/14/19  9:14 AM Phone: [phone] Fax: [phone]   [facility] Outpatient Rehabilitation [facility] [address] [place], [place], [address] Phone: [phone]   Fax:  [phone]  Name: Helen [name] MRN: [id number] Date of Birth: 06/20/1965

## 2019-07-17 ENCOUNTER — Other Ambulatory Visit: Payer: Self-pay

## 2019-07-17 ENCOUNTER — Ambulatory Visit: Payer: Medicare Other | Admitting: Physical Therapy

## 2019-07-17 ENCOUNTER — Encounter: Payer: Self-pay | Admitting: Physical Therapy

## 2019-07-17 DIAGNOSIS — M25512 Pain in left shoulder: Secondary | ICD-10-CM

## 2019-07-17 DIAGNOSIS — M6281 Muscle weakness (generalized): Secondary | ICD-10-CM

## 2019-07-17 DIAGNOSIS — M25612 Stiffness of left shoulder, not elsewhere classified: Secondary | ICD-10-CM

## 2019-07-17 DIAGNOSIS — G8929 Other chronic pain: Secondary | ICD-10-CM

## 2019-07-17 NOTE — Therapy (Signed)
[place], [place], [address] Phone: [phone]   Fax:  [phone]  Physical Therapy Treatment  Patient Details  Name: Helen [name] MRN: [id number] Date of Birth: [birth year] Referring Provider (PT): Mcarthur Rossetti, MD   Encounter Date: 07/17/2019  PT End of Session - 07/17/19 0932    Visit Number  5    Number of Visits  16    Date for PT Re-Evaluation  08/13/19    Authorization Type  UHC MCR    PT Start Time  0915    PT Stop Time  0955    PT Time Calculation (min)  40 min    Activity Tolerance  Patient tolerated treatment well    Behavior During Therapy  [facility] for tasks assessed/performed       Past Medical History:  Diagnosis Date  . Acid reflux   . Anemia   . Anxiety   . Chronic headaches   . Degenerative disc disease    lumbar  . Diabetes mellitus without complication ([place])   . Fibromyalgia   . Heart murmur   . Hypertension   . Scoliosis   . Seizures ([place])   . Stroke [facility]) 1997    Past Surgical History:  Procedure Laterality Date  . ABDOMINAL HYSTERECTOMY    . CESAREAN SECTION    . LAPAROSCOPIC UNILATERAL SALPINGO OOPHERECTOMY Right 2005    There were no vitals filed for this visit.  Subjective Assessment - 07/17/19 0931    Subjective  Patient reports she is doing really well this morning.    Patient Stated Goals  Get rid of pain in left shoulder so she can reach overhead better    Currently in Pain?  Yes    Pain Score  3     Pain Location  Shoulder    Pain Orientation  Left    Pain Descriptors / Indicators  Sore    Pain Type  Chronic pain    Pain Onset  More than a month ago    Pain Frequency  Constant         [facility] PT Assessment - 07/17/19 0001      Assessment   Medical Diagnosis  Chronic left shoulder pain    Referring Provider (PT)  Mcarthur Rossetti, MD      AROM   Right Shoulder Flexion  160 Degrees    Left Shoulder Flexion  158 Degrees                    [facility] Adult PT Treatment/Exercise - 07/17/19 0001      Exercises   Exercises  Shoulder      Shoulder Exercises: Seated   External Rotation  15 reps   2 sets   Theraband Level (Shoulder External Rotation)  Level 1 (Yellow)    External Rotation Limitations  no money      Shoulder Exercises: Standing   Flexion  10 reps   2 sets   Shoulder Flexion Weight (lbs)  1    Flexion Limitations  scaption    Extension  20 reps   2 sets   Theraband Level (Shoulder Extension)  Level 2 (Red)    Row  20 reps   2 sets   Theraband Level (Shoulder Row)  Level 2 (Red)      Shoulder Exercises: Pulleys   Flexion  3 minutes    Scaption  2 minutes      Shoulder Exercises: ROM/Strengthening  UBE (Upper Arm Bike)  L1 x 4 min (fwd/bwd)      Shoulder Exercises: Stretch   Wall Stretch - Flexion  5 reps;10 seconds    Wall Stretch - Flexion Limitations  wall slide    Other Shoulder Stretches  Upper trap and levator scap stretch x20 sec each             PT Education - 07/17/19 0932    Education Details  HEP    Person(s) Educated  Patient    Methods  Explanation;Demonstration;Verbal cues    Comprehension  Verbalized understanding;Returned demonstration;Verbal cues required;Need further instruction       PT Short Term Goals - 07/17/19 0935      PT SHORT TERM GOAL #1   Title  Patient will be I with initial HEP to progress in PT    Time  4    Period  Weeks    Status  On-going    Target Date  07/16/19      PT SHORT TERM GOAL #2   Title  Patient will report </= 4/10 pain level at rest to improve activity level    Baseline  Patient reports pain level 3-4/10 this visit - 07/17/2019    Time  4    Period  Weeks    Status  Achieved      PT SHORT TERM GOAL #3   Title  Patient will be able to reach a shelf at shoulder height with mod difficulty    Time  4    Period  Weeks    Status  Achieved        PT Long Term Goals - 06/18/19 1239      PT LONG TERM GOAL  #1   Title  Patient will be I with final HEP to maintain progress from PT.    Time  8    Period  Weeks    Status  New    Target Date  08/13/19      PT LONG TERM GOAL #2   Title  Patient will report improved functional level of </= 42% limitation on FOTO    Time  8    Period  Weeks    Status  New    Target Date  08/13/19      PT LONG TERM GOAL #3   Title  Patient will be able to reach into shelf at shoulder height with little to no difficulty    Time  8    Period  Weeks    Status  New    Target Date  08/13/19      PT LONG TERM GOAL #4   Title  Patient will be able to carry >/= 5-10 lbs with no difficulty to improve shopping    Time  8    Period  Weeks    Status  New    Target Date  08/13/19      PT LONG TERM GOAL #5   Title  Patient will be able to perform all grooming, bathing, and dressing tasks with little to no difficulty    Time  8    Period  Weeks    Status  New    Target Date  08/13/19            Plan - 07/17/19 0933    Clinical Impression Statement  Patient tolerated therapy well with no adverse effects. She continues to exhibit good range of motion and is tolerating her exercises well. Continues  to require occasional cueing for proper technique and avoiding shoulder shrug to over upper trap tightness. She would benefit from continued skilled PT to progress shoulder motion and strength so she can perform reaching, dressing, and grooming tasks without limitation.    PT Treatment/Interventions  ADLs/Self Care Home Management;Cryotherapy;Electrical Stimulation;Moist Heat;Therapeutic activities;Therapeutic exercise;Neuromuscular re-education;Manual techniques;Patient/family education;Dry needling;Passive range of motion;Joint Manipulations    PT Next Visit Plan  Assess HEP and progress PRN, modalities and/or manual for pain relief, progress AAROM using pulleys/ranger/dowel, periscapualr strengthening as able    PT Home Exercise Plan  [id number]: Supine shoulder flexion  with cane, supine shoulder ER with cane, standing wall slide flexion, seated row with yellow, seated ER with yellow, upper trap and levator stretch    Consulted and Agree with Plan of Care  Patient       Patient will benefit from skilled therapeutic intervention in order to improve the following deficits and impairments:  Decreased range of motion, Postural dysfunction, Decreased strength, Decreased activity tolerance, Pain  Visit Diagnosis: Chronic left shoulder pain  Muscle weakness (generalized)  Stiffness of left shoulder, not elsewhere classified     Problem List Patient Active Problem List   Diagnosis Date Noted  . HLD (hyperlipidemia) 11/05/2014  . Essential hypertension 11/05/2014  . Numbness 08/04/2014  . H/O fibromyalgia 08/04/2014  . Left upper extremity numbness   . Numbness of left lower extremity   . UTI 07/01/2008  . DIARRHEA, ACUTE 06/11/2008  . GERD 05/28/2008  . PERSISTENT DISORDER INITIATING/MAINTAINING SLEEP 04/01/2008  . STROKE 04/01/2008  . HEADACHE, CHRONIC 04/01/2008  . Morbid obesity (HCC) 03/16/2008  . BACK PAIN 12/17/2007  . Sleep apnea 12/17/2007  . 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 FIBROMYALGIA 12/03/2007    Rosana Hoes, PT, DPT, LAT, ATC 07/17/19  9:55 AM Phone: [phone] Fax: [phone]   [facility] [address] [place], [place], [address] Phone: [phone]   Fax:  [phone]  Name: EEVEE BORBON MRN: [id number] Date of Birth: 12-04-1965

## 2019-07-20 ENCOUNTER — Encounter: Payer: Self-pay | Admitting: Orthopaedic Surgery

## 2019-07-20 ENCOUNTER — Encounter: Payer: Medicare Other | Admitting: Physical Therapy

## 2019-07-20 ENCOUNTER — Other Ambulatory Visit: Payer: Self-pay

## 2019-07-20 ENCOUNTER — Ambulatory Visit (INDEPENDENT_AMBULATORY_CARE_PROVIDER_SITE_OTHER): Payer: Medicare Other | Admitting: Orthopaedic Surgery

## 2019-07-20 DIAGNOSIS — M25512 Pain in left shoulder: Secondary | ICD-10-CM | POA: Diagnosis not present

## 2019-07-20 DIAGNOSIS — G8929 Other chronic pain: Secondary | ICD-10-CM

## 2019-07-20 NOTE — Progress Notes (Signed)
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  I did not feel comfortable placing a steroid injection in her shoulder due to diabetes.  She still does not know what her blood sugars run but she said overall therapy has done great and anti-inflammatories have done well for her and that she can reach her shoulder above her head and behind her now and has great strength.  On examination of her left shoulder today she has excellent range of motion of that shoulder.  Her liftoff is negative.  She does not show any signs of impingement or rotator cuff deficit of the shoulder at all and basically has a normal exam.  Both me and her are very satisfied with her shoulder.  At this point follow-up can be as needed since she is doing so well.  All question concerns were answered and addressed.

## 2019-07-23 ENCOUNTER — Ambulatory Visit: Payer: Medicare Other | Admitting: Physical Therapy

## 2019-07-23 ENCOUNTER — Encounter: Payer: Self-pay | Admitting: Physical Therapy

## 2019-07-23 ENCOUNTER — Other Ambulatory Visit: Payer: Self-pay

## 2019-07-23 DIAGNOSIS — M25612 Stiffness of left shoulder, not elsewhere classified: Secondary | ICD-10-CM

## 2019-07-23 DIAGNOSIS — M25512 Pain in left shoulder: Secondary | ICD-10-CM | POA: Diagnosis not present

## 2019-07-23 DIAGNOSIS — G8929 Other chronic pain: Secondary | ICD-10-CM

## 2019-07-23 DIAGNOSIS — M6281 Muscle weakness (generalized): Secondary | ICD-10-CM

## 2019-07-23 NOTE — Therapy (Signed)
[place], [place], [address] Phone: [phone]   Fax:  [phone]  Physical Therapy Treatment  Patient Details  Name: Helen [name] MRN: [id number] Date of Birth: 10-01-1965 Referring Provider (PT): Mcarthur Rossetti, MD   Encounter Date: 07/23/2019  PT End of Session - 07/23/19 0832    Visit Number  6    Number of Visits  16    Date for PT Re-Evaluation  08/13/19    Authorization Type  UHC MCR    PT Start Time  0828    PT Stop Time  0910    PT Time Calculation (min)  42 min    Activity Tolerance  Patient tolerated treatment well    Behavior During Therapy  [facility] for tasks assessed/performed       Past Medical History:  Diagnosis Date  . Acid reflux   . Anemia   . Anxiety   . Chronic headaches   . Degenerative disc disease    lumbar  . Diabetes mellitus without complication ([place])   . Fibromyalgia   . Heart murmur   . Hypertension   . Scoliosis   . Seizures ([place])   . Stroke [facility]) 1997    Past Surgical History:  Procedure Laterality Date  . ABDOMINAL HYSTERECTOMY    . CESAREAN SECTION    . LAPAROSCOPIC UNILATERAL SALPINGO OOPHERECTOMY Right 2005    There were no vitals filed for this visit.  Subjective Assessment - 07/23/19 0831    Subjective  Patient reports she is doing well with no new issues.    Patient Stated Goals  Get rid of pain in left shoulder so she can reach overhead better    Currently in Pain?  No/denies         [facility] PT Assessment - 07/23/19 0001      AROM   Right Shoulder Flexion  160 Degrees    Right Shoulder ABduction  160 Degrees    Right Shoulder Internal Rotation  --   T10   Right Shoulder External Rotation  80 Degrees   T2   Left Shoulder Flexion  160 Degrees    Left Shoulder ABduction  160 Degrees    Left Shoulder Internal Rotation  --   T10   Left Shoulder External Rotation  80 Degrees   T2                  [facility] Adult PT  Treatment/Exercise - 07/23/19 0001      Exercises   Exercises  Shoulder      Shoulder Exercises: Standing   External Rotation  20 reps   2 sets   Theraband Level (Shoulder External Rotation)  Level 1 (Yellow)    Internal Rotation  20 reps   2 sets   Theraband Level (Shoulder Internal Rotation)  Level 1 (Yellow)    Flexion  15 reps   2 sets   Shoulder Flexion Weight (lbs)  1    Flexion Limitations  scaption    Extension  20 reps   2 sets   Theraband Level (Shoulder Extension)  Level 2 (Red)    Row  20 reps   2 sets   Theraband Level (Shoulder Row)  Level 2 (Red)      Shoulder Exercises: Pulleys   Flexion  2 minutes    Scaption  2 minutes      Shoulder Exercises: ROM/Strengthening   UBE (Upper Arm Bike)  L2 x 6 min (  fwd/bwd)      Shoulder Exercises: Stretch   Wall Stretch - Flexion  5 reps;10 seconds    Wall Stretch - Flexion Limitations  wall slide    Other Shoulder Stretches  Upper trap and levator scap stretch 2x20 sec each             PT Education - 07/23/19 0832    Education Details  Continued HEP    Person(s) Educated  Patient    Methods  Explanation;Demonstration;Verbal cues    Comprehension  Verbalized understanding;Returned demonstration;Verbal cues required;Need further instruction       PT Short Term Goals - 07/17/19 0935      PT SHORT TERM GOAL #1   Title  Patient will be I with initial HEP to progress in PT    Time  4    Period  Weeks    Status  On-going    Target Date  07/16/19      PT SHORT TERM GOAL #2   Title  Patient will report </= 4/10 pain level at rest to improve activity level    Baseline  Patient reports pain level 3-4/10 this visit - 07/17/2019    Time  4    Period  Weeks    Status  Achieved      PT SHORT TERM GOAL #3   Title  Patient will be able to reach a shelf at shoulder height with mod difficulty    Time  4    Period  Weeks    Status  Achieved        PT Long Term Goals - 07/23/19 0850      PT LONG TERM GOAL #1    Title  Patient will be I with final HEP to maintain progress from PT.    Time  8    Period  Weeks    Status  On-going    Target Date  08/13/19      PT LONG TERM GOAL #2   Title  Patient will report improved functional level of </= 42% limitation on FOTO    Time  8    Period  Weeks    Status  New    Target Date  08/13/19      PT LONG TERM GOAL #3   Title  Patient will be able to reach into shelf at shoulder height with little to no difficulty    Time  8    Period  Weeks    Status  Achieved    Target Date  --      PT LONG TERM GOAL #4   Title  Patient will be able to carry >/= 5-10 lbs with no difficulty to improve shopping    Time  8    Period  Weeks    Status  New    Target Date  08/13/19      PT LONG TERM GOAL #5   Title  Patient will be able to perform all grooming, bathing, and dressing tasks with little to no difficulty    Time  8    Period  Weeks    Status  Achieved    Target Date  --            Plan - 07/23/19 8756    Clinical Impression Statement  Patrient tolerated therapy well with no adverse effects. Patient exhibits equal left shoulder active range of motion to the opposite side and is progressing very well with her strengthening of the  shoulder. She does report continued deficit with lifting objects overhead and carrying heavy objects. She required cueing for proper technique and and control to avoid shrug. She would benefit from continued skilled PT to progress shoulder strength so she can perform all tasks without limitation.    PT Treatment/Interventions  ADLs/Self Care Home Management;Cryotherapy;Electrical Stimulation;Moist Heat;Therapeutic activities;Therapeutic exercise;Neuromuscular re-education;Manual techniques;Patient/family education;Dry needling;Passive range of motion;Joint Manipulations    PT Next Visit Plan  Assess HEP and progress PRN, modalities and/or manual for pain relief, progress AAROM using pulleys/ranger/dowel, periscapualr  strengthening as able    PT Home Exercise Plan  [id number]: Supine shoulder flexion with cane, supine shoulder ER with cane, standing wall slide flexion, seated row with yellow, seated ER with yellow, upper trap and levator stretch    Consulted and Agree with Plan of Care  Patient       Patient will benefit from skilled therapeutic intervention in order to improve the following deficits and impairments:  Decreased range of motion, Postural dysfunction, Decreased strength, Decreased activity tolerance, Pain  Visit Diagnosis: Chronic left shoulder pain  Muscle weakness (generalized)  Stiffness of left shoulder, not elsewhere classified     Problem List Patient Active Problem List   Diagnosis Date Noted  . HLD (hyperlipidemia) 11/05/2014  . Essential hypertension 11/05/2014  . Numbness 08/04/2014  . H/O fibromyalgia 08/04/2014  . Left upper extremity numbness   . Numbness of left lower extremity   . UTI 07/01/2008  . DIARRHEA, ACUTE 06/11/2008  . GERD 05/28/2008  . PERSISTENT DISORDER INITIATING/MAINTAINING SLEEP 04/01/2008  . STROKE 04/01/2008  . HEADACHE, CHRONIC 04/01/2008  . Morbid obesity (HCC) 03/16/2008  . BACK PAIN 12/17/2007  . Sleep apnea 12/17/2007  . 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 FIBROMYALGIA 12/03/2007    Rosana Hoes, PT, DPT, LAT, ATC 07/23/19  9:12 AM Phone: [phone] Fax: [phone]   [facility] [address] [place], [place], [address] Phone: [phone]   Fax:  [phone]  Name: Helen [name] MRN: [id number] Date of Birth: April 06, 1965

## 2019-07-27 ENCOUNTER — Ambulatory Visit: Payer: Medicare Other | Admitting: Physical Therapy

## 2019-08-03 ENCOUNTER — Other Ambulatory Visit: Payer: Self-pay

## 2019-08-03 ENCOUNTER — Encounter: Payer: Self-pay | Admitting: Physical Therapy

## 2019-08-03 ENCOUNTER — Ambulatory Visit: Payer: Medicare Other | Attending: Orthopaedic Surgery | Admitting: Physical Therapy

## 2019-08-03 ENCOUNTER — Ambulatory Visit: Payer: Medicare Other | Attending: Internal Medicine

## 2019-08-03 ENCOUNTER — Encounter: Payer: Medicare Other | Admitting: Physical Therapy

## 2019-08-03 DIAGNOSIS — M25512 Pain in left shoulder: Secondary | ICD-10-CM | POA: Insufficient documentation

## 2019-08-03 DIAGNOSIS — M6281 Muscle weakness (generalized): Secondary | ICD-10-CM | POA: Diagnosis present

## 2019-08-03 DIAGNOSIS — M25612 Stiffness of left shoulder, not elsewhere classified: Secondary | ICD-10-CM | POA: Diagnosis present

## 2019-08-03 DIAGNOSIS — G8929 Other chronic pain: Secondary | ICD-10-CM | POA: Insufficient documentation

## 2019-08-03 DIAGNOSIS — Z23 Encounter for immunization: Secondary | ICD-10-CM

## 2019-08-03 NOTE — Progress Notes (Signed)
   Covid-19 Vaccination Clinic  Name:  Helen [name]    MRN: [id number] DOB: [birth year]  08/03/2019  Helen [name] was observed post Covid-19 immunization for 15 minutes without incident. She was provided with Vaccine Information Sheet and instruction to access the V-Safe system.   Helen [name] was instructed to call 911 with any severe reactions post vaccine: Marland Kitchen Difficulty breathing  . Swelling of face and throat  . A fast heartbeat  . A bad rash all over body  . Dizziness and weakness   Immunizations Administered    Name Date Dose VIS Date Route   Pfizer COVID-19 Vaccine 08/03/2019  9:20 AM 0.3 mL 05/20/2018 Intramuscular   Manufacturer: [employer], [employer]   Lot: [id number]   NDC: [address]-5

## 2019-08-03 NOTE — Therapy (Signed)
[place], [place], [address] Phone: [phone]   Fax:  [phone]  Physical Therapy Treatment / Discharge  Patient Details  Name: Helen [name] MRN: [id number] Date of Birth: March 03, 1966 Referring Provider (PT): Mcarthur Rossetti, MD   Encounter Date: 08/03/2019  PT End of Session - 08/03/19 1139    Visit Number  7    Number of Visits  16    Date for PT Re-Evaluation  08/13/19    Authorization Type  UHC MCR    PT Start Time  1130    PT Stop Time  1200    PT Time Calculation (min)  30 min    Activity Tolerance  Patient tolerated treatment well    Behavior During Therapy  [facility] for tasks assessed/performed       Past Medical History:  Diagnosis Date  . Acid reflux   . Anemia   . Anxiety   . Chronic headaches   . Degenerative disc disease    lumbar  . Diabetes mellitus without complication ([place])   . Fibromyalgia   . Heart murmur   . Hypertension   . Scoliosis   . Seizures ([place])   . Stroke [facility]) 1997    Past Surgical History:  Procedure Laterality Date  . ABDOMINAL HYSTERECTOMY    . CESAREAN SECTION    . LAPAROSCOPIC UNILATERAL SALPINGO OOPHERECTOMY Right 2005    There were no vitals filed for this visit.  Subjective Assessment - 08/03/19 1136    Subjective  Patient reports she is doing well. Her shoulder is still feeling good and she is ready to be discharged.    Patient Stated Goals  Get rid of pain in left shoulder so she can reach overhead better    Currently in Pain?  No/denies         [facility] PT Assessment - 08/03/19 0001      Assessment   Medical Diagnosis  Chronic left shoulder pain    Referring Provider (PT)  Mcarthur Rossetti, MD      Precautions   Precautions  None      Restrictions   Weight Bearing Restrictions  No      Prior Function   Level of Independence  Independent      Observation/Other Assessments   Observations  Patient appears in no  apparent distress    Focus on Therapeutic Outcomes (FOTO)   19% limitation      AROM   Overall AROM Comments  Patient exhibits full and equal shoulder AROM bilaterally without pain      Strength   Right Shoulder Flexion  5/5    Right Shoulder ABduction  5/5    Right Shoulder Internal Rotation  5/5    Right Shoulder External Rotation  5/5    Left Shoulder Flexion  5/5    Left Shoulder ABduction  5/5    Left Shoulder Internal Rotation  5/5    Left Shoulder External Rotation  5/5      Transfers   Transfers  Independent with all Transfers                   [facility] Adult PT Treatment/Exercise - 08/03/19 0001      Exercises   Exercises  Shoulder      Shoulder Exercises: Standing   External Rotation  20 reps   2 sets   Theraband Level (Shoulder External Rotation)  Level 1 (Yellow)    Internal Rotation  20 reps   2 sets   Theraband Level (Shoulder Internal Rotation)  Level 1 (Yellow)    Flexion  15 reps   2 sets   Shoulder Flexion Weight (lbs)  2    Flexion Limitations  scaption    Extension  20 reps   2 sets   Theraband Level (Shoulder Extension)  Level 2 (Red)    Row  20 reps    Theraband Level (Shoulder Row)  Level 2 (Red)      Shoulder Exercises: Pulleys   Flexion  2 minutes      Shoulder Exercises: ROM/Strengthening   UBE (Upper Arm Bike)  L4 x 4 min (fwd/bwd)             PT Education - 08/03/19 1137    Education Details  HEP    Person(s) Educated  Patient    Methods  Explanation;Demonstration;Verbal cues    Comprehension  Verbalized understanding;Returned demonstration;Verbal cues required;Need further instruction       PT Short Term Goals - 08/03/19 1157      PT SHORT TERM GOAL #1   Title  Patient will be I with initial HEP to progress in PT    Time  4    Period  Weeks    Status  Achieved    Target Date  07/16/19      PT SHORT TERM GOAL #2   Title  Patient will report </= 4/10 pain level at rest to improve activity level    Baseline   Patient reports pain level 3-4/10 this visit - 07/17/2019    Time  4    Period  Weeks    Status  Achieved      PT SHORT TERM GOAL #3   Title  Patient will be able to reach a shelf at shoulder height with mod difficulty    Time  4    Period  Weeks    Status  Achieved        PT Long Term Goals - 08/03/19 1157      PT LONG TERM GOAL #1   Title  Patient will be I with final HEP to maintain progress from PT.    Time  8    Period  Weeks    Status  Achieved      PT LONG TERM GOAL #2   Title  Patient will report improved functional level of </= 42% limitation on FOTO    Time  8    Period  Weeks    Status  Achieved      PT LONG TERM GOAL #3   Title  Patient will be able to reach into shelf at shoulder height with little to no difficulty    Time  8    Period  Weeks    Status  Achieved      PT LONG TERM GOAL #4   Title  Patient will be able to carry >/= 5-10 lbs with no difficulty to improve shopping    Time  8    Period  Weeks    Status  Achieved      PT LONG TERM GOAL #5   Title  Patient will be able to perform all grooming, bathing, and dressing tasks with little to no difficulty    Time  8    Period  Weeks    Status  Achieved            Plan - 08/03/19 1139    Clinical  Impression Statement  Patient has achieved all established goals and is independent with her exercise program. She demonstrated good range of motion and strength with great improvement in functional level. Patient will be discharged to independent HEP as formal PT is no longer indicated.    PT Treatment/Interventions  ADLs/Self Care Home Management;Cryotherapy;Electrical Stimulation;Moist Heat;Therapeutic activities;Therapeutic exercise;Neuromuscular re-education;Manual techniques;Patient/family education;Dry needling;Passive range of motion;Joint Manipulations    PT Next Visit Plan  NA - patient discharged    PT Home Exercise Plan  [id number]: Supine shoulder flexion with cane, supine shoulder ER with  cane, standing wall slide flexion, seated row with yellow, seated ER with yellow, upper trap and levator stretch    Consulted and Agree with Plan of Care  Patient       Patient will benefit from skilled therapeutic intervention in order to improve the following deficits and impairments:  Decreased range of motion, Postural dysfunction, Decreased strength, Decreased activity tolerance, Pain  Visit Diagnosis: Chronic left shoulder pain  Muscle weakness (generalized)  Stiffness of left shoulder, not elsewhere classified     Problem List Patient Active Problem List   Diagnosis Date Noted  . HLD (hyperlipidemia) 11/05/2014  . Essential hypertension 11/05/2014  . Numbness 08/04/2014  . H/O fibromyalgia 08/04/2014  . Left upper extremity numbness   . Numbness of left lower extremity   . UTI 07/01/2008  . DIARRHEA, ACUTE 06/11/2008  . GERD 05/28/2008  . PERSISTENT DISORDER INITIATING/MAINTAINING SLEEP 04/01/2008  . STROKE 04/01/2008  . HEADACHE, CHRONIC 04/01/2008  . Morbid obesity ([place]) 03/16/2008  . BACK PAIN 12/17/2007  . Sleep apnea 12/17/2007  . 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 FIBROMYALGIA 12/03/2007    PHYSICAL THERAPY DISCHARGE SUMMARY  Visits from Start of Care: 7  Current functional level related to goals / functional outcomes: See above   Remaining deficits: See above   Education / Equipment: HEP Plan: Patient agrees to discharge.  Patient goals were met. Patient is being discharged due to meeting the stated rehab goals.  ?????     Hilda Blades, PT, DPT, LAT, ATC 08/03/19  12:05 PM Phone: [phone] Fax: [place] [facility] [address] [place], [place], [address] Phone: [phone]   Fax:  [phone]  Name: Helen [name] MRN: [id number] Date of Birth: 04-01-1965

## 2019-08-12 ENCOUNTER — Encounter: Payer: Medicare Other | Admitting: Physical Therapy

## 2019-09-15 ENCOUNTER — Other Ambulatory Visit: Payer: Self-pay | Admitting: Orthopaedic Surgery

## 2019-09-17 ENCOUNTER — Other Ambulatory Visit: Payer: Self-pay

## 2019-09-17 ENCOUNTER — Encounter: Payer: Self-pay | Admitting: Neurology

## 2019-09-17 ENCOUNTER — Ambulatory Visit (INDEPENDENT_AMBULATORY_CARE_PROVIDER_SITE_OTHER): Payer: Medicare Other | Admitting: Neurology

## 2019-09-17 VITALS — BP 131/86 | Resp 20 | Ht 64.0 in | Wt 262.0 lb

## 2019-09-17 DIAGNOSIS — G40009 Localization-related (focal) (partial) idiopathic epilepsy and epileptic syndromes with seizures of localized onset, not intractable, without status epilepticus: Secondary | ICD-10-CM

## 2019-09-17 MED ORDER — LEVETIRACETAM 500 MG PO TABS: ORAL_TABLET | ORAL | 11 refills | Status: DC

## 2019-09-17 MED ORDER — VIMPAT 150 MG PO TABS: 150.0000 mg | ORAL_TABLET | Freq: Two times a day (BID) | ORAL | 5 refills | Status: DC

## 2019-09-17 NOTE — Progress Notes (Signed)
NEUROLOGY FOLLOW UP OFFICE NOTE  Helen [name] [id number] [birth year]  HISTORY OF PRESENT ILLNESS: I had the pleasure of seeing Helen [name] in follow-up in the neurology clinic on 09/17/2019.  The patient was last seen 3 months ago for seizures. She is alone in the office today. Records and images were personally reviewed where available.  I personally reviewed MRI brain with and without contrast done 06/2019 which was normal, hippocampi symmetric with no abnormal signal or enhancement seen. Her routine and 48-hour EEG in 05/2019 was normal, however typical events were not captured. She was restarted on Levetiracetam 520m BID and Vimpat 1530mBID on her last visit, and is not sure if she is still having seizures. She is amnestic of them, her aide has mentioned a couple of times where she stares off and does not respond when aide tries to get her attention. She woke up one time a few weeks ago where her tongue seemed swollen. She had an odd taste or smell a few weeks ago that lasted continuously for a few days then went away. She has not woken up on the ground. She has had several headaches, a few days ago she had a different type of sharp pain in the right parietal region that radiated to the occipital region then it felt like it moved horizontally across the back of her head. This only lasted 10 seconds but she felt queasy like she was about to pass out. She has occasional headaches in the temple regions, Ibuprofen helps when it is really bad, otherwise she just lays down with a cool towel on her face. She endorses neck pain. Her shoulder pain has significantly improved. She denies any falls. She also takes gabapentin 30064mID for nerve pain. Her aide makes sure she takes her medications, she endorses compliance.    History on Initial Assessment 05/26/2019: This is a 53 17ar old right-handed woman with a history of hypertension, diabetes, fibromyalgia, stroke in 1997, presenting for evaluation of  seizures. 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 She has been seeing neurologist Dr. RunTrula Orend when she first started seeing him, her mother reported staring/unresponsive episodes. She reports "there was something showing up on the brain wave test," and she was started on Vimpat and Keppra at the same time in 2013 or 2014. She is not sure if the episodes quieted down because she is mostly alone at home. She has a CNA coming daily  who tells her she is not responding, last episode was a few days ago. The CNA tells here mostly everyday that something happened. She has occasional episodes of a metallic taste in her mouth. Sometimes she comes to with her teeth down on her tongue, unsure how it happened. No incontinence. She has occasional twitching in her eyes, either leg or arm. One time in church last month, both hands started trembling and she could not stop them. There was a prickly sensation at the tip of her fingers that lasted more than 15 seconds. She could understand things around her but her body felt weird. There was slight dizziness and a hot/cold sensation. She lives with her sister and would be told that she does not answer/hear them. She has gaps in time "all the time." When driving, she does not remember passing certain places. When she  laughs too hard, she can get lightheaded and things go black, then she wakes up on the floor. This happened a couple of weeks ago while she was sitting. She thinks she has seizures in her sleep, she wakes up in the morning with her teeth on her tongue. She ran out of Keppra and Vimpat 3 months ago and feels she is having more of the night events.  She has had headaches for several years with pain on the vertex and right side occurring around twice a week. No associated nausea/vomiting. There  are some flickering lights. Tylenol helps. She has intermittent blurred vision that her eye doctor thinks is related to fibromyalgia. She was diagnosed with fibromyalgia in 2006, she has very sensitive skin even to light touch, in constant pain 24/7. When really bad, she needs help eating because she can barely lift her arms up. Her left arm has been in constant severe daily pain the past month, which has affected her sleep. She has been told she has carpal tunnel syndrome on the left hand 5 years ago. She was getting injections in her neck and back. She takes gabapentin once a day, and is not aware she was told to take it BID. She denies being under a lot of stress.   Epilepsy Risk Factors:  She had a normal birth and early development.  There is no history of febrile convulsions, CNS infections such as meningitis/encephalitis, significant traumatic brain injury, neurosurgical procedures, or family history of seizures.  PAST MEDICAL HISTORY: Past Medical History:  Diagnosis Date  . Acid reflux   . Anemia   . Anxiety   . Chronic headaches   . Degenerative disc disease    lumbar  . Diabetes mellitus without complication ([place])   . Fibromyalgia   . Heart murmur   . Hypertension   . Scoliosis   . Seizures ([place])   . Stroke [facility]) 1997    MEDICATIONS: Current Outpatient Medications on File Prior to Visit  Medication Sig Dispense Refill  . ACCU-CHEK AVIVA PLUS test strip     . acetaminophen (TYLENOL) 325 MG tablet Take 2 tablets (650 mg total) by mouth every 6 (six) hours as needed for mild pain, moderate pain or headache. 60 tablet 0  . Alcohol Swabs (ALCOHOL PREP) 70 % PADS     . ALLERGY RELIEF 10 MG tablet Take 10 mg by mouth daily.    Marland Kitchen aspirin EC 81 MG tablet Take 1 tablet (81 mg total) by mouth daily. 30 tablet 0  . ASSURE COMFORT LANCETS 30G MISC     . atorvastatin (LIPITOR) 20 MG tablet Take 20 mg by mouth daily.    . Blood Glucose Calibration (ACCU-CHEK AVIVA) SOLN     . Blood  Glucose Monitoring Suppl (ACCU-CHEK AVIVA PLUS) W/DEVICE KIT     . cholecalciferol (VITAMIN D) 1000 units tablet Take 1,000 Units by mouth daily.    . diclofenac sodium (VOLTAREN) 1 % GEL Apply 1 application topically daily as needed (back pain).     . Ferrous Gluconate (IRON 27 PO) Take 1 tablet by mouth daily.    . fluticasone (FLONASE) 50 MCG/ACT nasal spray Place 2 sprays into both nostrils daily. 16 g 0  . gabapentin (NEURONTIN) 300 MG capsule Take 300 mg by mouth 2 (two) times daily.    Marland Kitchen glipiZIDE-metformin (METAGLIP) 5-500 MG tablet Take 2 tablets by mouth 2 (two) times daily as needed (CBG >150).   3  . insulin regular (NOVOLIN R,HUMULIN R)  100 units/mL injection Inject 4-6 Units into the skin daily as needed for high blood sugar (CBG >150).    . Lacosamide (VIMPAT) 150 MG TABS Take 1 tablet (150 mg total) by mouth 2 (two) times daily. 60 tablet 5  . Lancet Devices (ADJUSTABLE LANCING DEVICE) MISC     . levETIRAcetam (KEPPRA) 500 MG tablet Take 1 tablet (500 mg total) by mouth 2 (two) times daily. 60 tablet 11  . nabumetone (RELAFEN) 750 MG tablet TAKE 1 TABLET BY MOUTH 2 TIMES DAILY AS NEEDED 60 tablet 1  . olmesartan-hydrochlorothiazide (BENICAR HCT) 20-12.5 MG tablet Take 1 tablet by mouth daily. 90 tablet 1  . omeprazole (PRILOSEC) 20 MG capsule Take 20 mg by mouth daily before breakfast.    . ondansetron (ZOFRAN) 4 MG tablet Take 4 mg by mouth every 8 (eight) hours as needed for nausea or vomiting.    Marland Kitchen tiZANidine (ZANAFLEX) 2 MG tablet Take 2 mg by mouth daily as needed (fibromyalgia).    . traMADol (ULTRAM) 50 MG tablet Take 1-2 tablets by mouth every 6 hours as needed. 40 tablet 0  . Travoprost, BAK Free, (TRAVATAN) 0.004 % SOLN ophthalmic solution 1 drop at bedtime.    . Vitamin D, Ergocalciferol, (DRISDOL) 1.25 MG (50000 UNIT) CAPS capsule Take 50,000 Units by mouth once a week.     No current facility-administered medications on file prior to visit.    ALLERGIES: Allergies   Allergen Reactions  . Pregabalin Other (See Comments)    Zoned out, talking crazy  . Topiramate Other (See Comments)    REACTION: caused "inside seizures"  . Metronidazole Rash  . Milnacipran Palpitations    FAMILY HISTORY: Family History  Problem Relation Age of Onset  . Diabetes Mother   . Hyperlipidemia Mother   . Heart attack Father   . Diabetes Sister   . Breast cancer Sister   . Diabetes Brother   . Breast cancer Paternal Grandmother   . Colon cancer Neg Hx     SOCIAL HISTORY: Social History   Socioeconomic History  . Marital status: Divorced    Spouse name: Not on file  . Number of children: Not on file  . Years of education: Not on file  . Highest education level: Not on file  Occupational History  . Not on file  Tobacco Use  . Smoking status: Never Smoker  . Smokeless tobacco: Never Used  Substance and Sexual Activity  . Alcohol use: No  . Drug use: No  . Sexual activity: Not on file  Other Topics Concern  . Not on file  Social History Narrative   Right handed    One story home few steps   Lives with sister    Social Determinants of Health   Financial Resource Strain:   . Difficulty of Paying Living Expenses:   Food Insecurity:   . Worried About Charity fundraiser in the Last Year:   . Arboriculturist in the Last Year:   Transportation Needs:   . Film/video editor (Medical):   Marland Kitchen Lack of Transportation (Non-Medical):   Physical Activity:   . Days of Exercise per Week:   . Minutes of Exercise per Session:   Stress:   . Feeling of Stress :   Social Connections:   . Frequency of Communication with Friends and Family:   . Frequency of Social Gatherings with Friends and Family:   . Attends Religious Services:   . Active Member of Clubs  or Organizations:   . Attends Archivist Meetings:   Marland Kitchen Marital Status:   Intimate Partner Violence:   . Fear of Current or Ex-Partner:   . Emotionally Abused:   Marland Kitchen Physically Abused:   .  Sexually Abused:     PHYSICAL EXAM: Vitals:   09/17/19 0823  BP: 131/86  Resp: 20   General: No acute distress Head:  Normocephalic/atraumatic Skin/Extremities: No rash, no edema Neurological Exam: alert and oriented to person, place, and time. No aphasia or dysarthria. Fund of knowledge is appropriate.  Recent and remote memory are impaired.  Attention and concentration are normal. Cranial nerves: Pupils equal, round, reactive to light. Extraocular movements intact with no nystagmus. Visual fields full.. No facial asymmetry.  Motor: Bulk and tone normal, muscle strength 5/5 throughout with no pronator drift.  Finger to nose testing intact.  Gait wide-based, no ataxia   IMPRESSION: This is a 54 yo RH woman with a history of hypertension, diabetes, fibromyalgia, stroke in 1997, and seizures. She describes a metallic taste, staring/unresponsive episodes, gaps in time, as well as waking up with near-tongue bites. She has been told her EEG in the past was abnormal, her recent 48-hour EEG was normal, MRI brain normal. Etiology of seizures unclear, she continues to report being told of staring/unresponsive episodes. Increase Levetiracetam to 729m BID, continue Vimpat 1518mBID. She is also on gabapentin 30049mID for nerve pain. She was advised to keep a calendar of the episodes, and if still frequent on next visit, we will plan for inpatient vEEG monitoring for seizure classification. She does not drive, we again discussed [place] driving laws to stop driving after a seizure until 6 months seizure-free. Follow-up in 6 months, she knows to call for any changes.   Thank you for allowing me to participate in her care.  Please do not hesitate to call for any questions or concerns.   KarEllouise Newer.D.   CC: Dr. DeaMarlou Sa

## 2019-09-17 NOTE — Patient Instructions (Signed)
1. Increase Keppra (Levetiracetam) 500mg : Take 1 and 1/2 tablets twice a day  2. Continue Vimpat 150mg  twice a day  3. Please ask your aide to mark the episodes of staring/not responding on a calendar so we can see how frequent they are as we increase your seizure medication  4. Follow-up in 6 months, call for any changes.  Seizure Precautions: 1. If medication has been prescribed for you to prevent seizures, take it exactly as directed.  Do not stop taking the medicine without talking to your doctor first, even if you have not had a seizure in a long time.   2. Avoid activities in which a seizure would cause danger to yourself or to others.  Don't operate dangerous machinery, swim alone, or climb in high or dangerous places, such as on ladders, roofs, or girders.  Do not drive unless your doctor says you may.  3. If you have any warning that you may have a seizure, lay down in a safe place where you can't hurt yourself.    4.  No driving for 6 months from last seizure, as per [place].   Please refer to the following link on the Epilepsy Foundation of America's website for more information: http://www.epilepsyfoundation.org/answerplace/Social/driving/drivingu.cfm   5.  Maintain good sleep hygiene. Avoid alcohol.  6.  Contact your doctor if you have any problems that may be related to the medicine you are taking.  7.  Call 911 and bring the patient back to the ED if:        A.  The seizure lasts longer than 5 minutes.       B.  The patient doesn't awaken shortly after the seizure  C.  The patient has new problems such as difficulty seeing, speaking or moving  D.  The patient was injured during the seizure  E.  The patient has a temperature over 102 F (39C)  F.  The patient vomited and now is having trouble breathing

## 2019-09-25 ENCOUNTER — Ambulatory Visit: Payer: Medicare Other | Admitting: Neurology

## 2019-10-26 ENCOUNTER — Ambulatory Visit: Payer: Medicare Other | Admitting: Neurology

## 2019-10-26 ENCOUNTER — Other Ambulatory Visit: Payer: Self-pay | Admitting: Orthopaedic Surgery

## 2019-10-26 NOTE — Telephone Encounter (Signed)
Pls advise.  

## 2019-11-09 ENCOUNTER — Other Ambulatory Visit: Payer: Self-pay | Admitting: Neurology

## 2019-11-09 ENCOUNTER — Other Ambulatory Visit: Payer: Self-pay | Admitting: Orthopaedic Surgery

## 2019-12-29 ENCOUNTER — Other Ambulatory Visit: Payer: Self-pay | Admitting: Orthopaedic Surgery

## 2020-02-03 ENCOUNTER — Other Ambulatory Visit: Payer: Self-pay | Admitting: Orthopaedic Surgery

## 2020-03-04 ENCOUNTER — Other Ambulatory Visit: Payer: Self-pay | Admitting: Orthopaedic Surgery

## 2020-03-30 ENCOUNTER — Encounter: Payer: Self-pay | Admitting: Neurology

## 2020-03-30 ENCOUNTER — Telehealth (INDEPENDENT_AMBULATORY_CARE_PROVIDER_SITE_OTHER): Payer: Medicare Other | Admitting: Neurology

## 2020-03-30 ENCOUNTER — Other Ambulatory Visit: Payer: Self-pay

## 2020-03-30 VITALS — Ht 64.0 in | Wt 262.0 lb

## 2020-03-30 DIAGNOSIS — G40009 Localization-related (focal) (partial) idiopathic epilepsy and epileptic syndromes with seizures of localized onset, not intractable, without status epilepticus: Secondary | ICD-10-CM | POA: Diagnosis not present

## 2020-03-30 MED ORDER — LEVETIRACETAM 500 MG PO TABS: ORAL_TABLET | ORAL | 3 refills | Status: DC

## 2020-03-30 MED ORDER — VIMPAT 150 MG PO TABS: 1.0000 | ORAL_TABLET | Freq: Two times a day (BID) | ORAL | 3 refills | Status: DC

## 2020-03-30 NOTE — Progress Notes (Signed)
Virtual Visit via Video Note The purpose of this virtual visit is to provide medical care while limiting exposure to the novel coronavirus.    Consent was obtained for video visit:  Yes.   Answered questions that patient had about telehealth interaction:  Yes.   I discussed the limitations, risks, security and privacy concerns of performing an evaluation and management service by telemedicine. I also discussed with the patient that there may be a patient responsible charge related to this service. The patient expressed understanding and agreed to proceed.  Pt location: Home Physician Location: office Name of referring provider:  Rogers Blocker, MD I connected with Tora Kindred at patients initiation/request on 03/30/2020 at  3:30 PM EST by video enabled telemedicine application and verified that I am speaking with the correct person using two identifiers. Pt MRN:  [id number] Pt DOB:  May 04, 1965 Video Participants:  Tora Kindred   History of Present Illness:  The patient had a virtual video visit on 03/30/2020. She was last seen 6 months ago for seizures. She switched today's visit to a virtual visit because she has a sore throat, nasal congestion, and confirmed positive COVID test. On her last visit, she was reporting concerns for continued seizures where her aide would mention that she does not respond/stares off. Levetiracetam dose increased to 762m BID, she is also on Vimpat 1553mBID and gabapentin 30039mID for nerve pain. She reports doing well. Her aide has not mentioned any staring/unresponsive episodes. She denies waking up with tongue bite/swelling. She denies any loss of time, no olfactory/gustatory hallucinations. She has tingling in her feet and fingertips. She has occasional headaches but they do not last long, with good response to Ibuprofen. No dizziness, vision changes, no falls. She tosses and turns at night and takes naps when tired. Mood is good.    History on Initial  Assessment 05/26/2019: This is a 55 9ar old right-handed woman with a history of hypertension, diabetes, fibromyalgia, stroke in 1997, presenting for evaluation of seizures. 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 She has been seeing neurologist Dr. RunTrula Orend when she first started seeing him, her mother reported staring/unresponsive episodes. She reports "there was something showing up on the brain wave test," and she was started on Vimpat and Keppra at the same time in 2013 or 2014. She is not sure if the episodes quieted down because she is mostly alone at home. She has a CNA coming daily  who tells her she is not responding, last episode was a few days ago. The CNA tells here mostly everyday that something happened. She has occasional episodes of a metallic taste in her mouth. Sometimes she comes to with her teeth down on her tongue, unsure how it happened. No incontinence. She has occasional twitching in her eyes, either leg or arm. One time in church last month, both hands started trembling and she could not stop them. There was a prickly sensation at the tip of her fingers that lasted more than 15 seconds. She could understand things around her but her body felt weird. There was slight dizziness and a hot/cold sensation. She lives with her sister and would be told that she does not answer/hear them. She has gaps in time "all the time." When driving, she does not remember passing  certain places. When she laughs too hard, she can get lightheaded and things go black, then she wakes up on the floor. This happened a couple of weeks ago while she was sitting. She thinks she has seizures in her sleep, she wakes up in the morning with her teeth on her tongue. She ran out of Keppra and Vimpat 3 months ago and feels she is having more of the  night events.  She has had headaches for several years with pain on the vertex and right side occurring around twice a week. No associated nausea/vomiting. There are some flickering lights. Tylenol helps. She has intermittent blurred vision that her eye doctor thinks is related to fibromyalgia. She was diagnosed with fibromyalgia in 2006, she has very sensitive skin even to light touch, in constant pain 24/7. When really bad, she needs help eating because she can barely lift her arms up. Her left arm has been in constant severe daily pain the past month, which has affected her sleep. She has been told she has carpal tunnel syndrome on the left hand 5 years ago. She was getting injections in her neck and back. She takes gabapentin once a day, and is not aware she was told to take it BID. She denies being under a lot of stress.   Epilepsy Risk Factors:  She had a normal birth and early development.  There is no history of febrile convulsions, CNS infections such as meningitis/encephalitis, significant traumatic brain injury, neurosurgical procedures, or family history of seizures.  Diagnostic Data:  MRI brain with and without contrast done 06/2019 which was normal, hippocampi symmetric with no abnormal signal or enhancement seen. Her routine and 48-hour EEG in 05/2019 was normal, however typical events were not captured.     Current Outpatient Medications on File Prior to Visit  Medication Sig Dispense Refill  . ACCU-CHEK AVIVA PLUS test strip     . acetaminophen (TYLENOL) 325 MG tablet Take 2 tablets (650 mg total) by mouth every 6 (six) hours as needed for mild pain, moderate pain or headache. 60 tablet 0  . Alcohol Swabs (ALCOHOL PREP) 70 % PADS     . ALLERGY RELIEF 10 MG tablet Take 10 mg by mouth daily.    Marland Kitchen aspirin EC 81 MG tablet Take 1 tablet (81 mg total) by mouth daily. 30 tablet 0  . ASSURE COMFORT LANCETS 30G MISC     . atorvastatin (LIPITOR) 20 MG tablet Take 20 mg by mouth daily.    .  Blood Glucose Calibration (ACCU-CHEK AVIVA) SOLN     . Blood Glucose Monitoring Suppl (ACCU-CHEK AVIVA PLUS) W/DEVICE KIT     . cholecalciferol (VITAMIN D) 1000 units tablet Take 1,000 Units by mouth daily.    . Ferrous Gluconate (IRON 27 PO) Take 1 tablet by mouth daily.    Marland Kitchen gabapentin (NEURONTIN) 300 MG capsule TAKE 1 CAPSULE BY MOUTH 2 TIMES DAILY 60 capsule 3  . glipiZIDE-metformin (METAGLIP) 5-500 MG tablet Take 2 tablets by mouth 2 (two) times daily as needed (CBG >150).   3  . insulin regular (NOVOLIN R,HUMULIN R) 100 units/mL injection Inject 4-6 Units into the skin daily as needed for high blood sugar (CBG >150).    Elmore Guise Devices (ADJUSTABLE LANCING DEVICE) MISC     . levETIRAcetam (KEPPRA) 500 MG tablet Take 1 and 1/2 tablets twice a day 90 tablet 11  . nabumetone (RELAFEN) 750 MG tablet TAKE 1 TABLET BY MOUTH 2 TIMES DAILY AS NEEDED 60  tablet 1  . olmesartan-hydrochlorothiazide (BENICAR HCT) 20-12.5 MG tablet Take 1 tablet by mouth daily. 90 tablet 1  . omeprazole (PRILOSEC) 20 MG capsule Take 20 mg by mouth daily before breakfast.    . ondansetron (ZOFRAN) 4 MG tablet Take 4 mg by mouth every 8 (eight) hours as needed for nausea or vomiting.    . traMADol (ULTRAM) 50 MG tablet Take 1-2 tablets by mouth every 6 hours as needed. 40 tablet 0  . Travoprost, BAK Free, (TRAVATAN) 0.004 % SOLN ophthalmic solution 1 drop at bedtime.    Marland Kitchen VIMPAT 150 MG TABS TAKE 1 TABLET BY MOUTH 2 TIMES DAILY 180 tablet 3  . Vitamin D, Ergocalciferol, (DRISDOL) 1.25 MG (50000 UNIT) CAPS capsule Take 50,000 Units by mouth once a week.     No current facility-administered medications on file prior to visit.     Observations/Objective:   Vitals:   03/30/20 0759  Weight: 262 lb (118.8 kg)  Height: 5' 4"  (1.626 m)   GEN:  The patient appears stated age and is in NAD.  Neurological examination: Patient is awake, alert. No aphasia or dysarthria. Intact fluency and comprehension. Remote and recent  memory intact. Cranial nerves: Extraocular movements intact with no nystagmus. No facial asymmetry. Motor: moves all extremities symmetrically, at least anti-gravity x 4.    Assessment and Plan:   This is a 55 yo RH woman with a history of hypertension, diabetes, fibromyalgia, stroke in 1997, and seizures. She describes a metallic taste, staring/unresponsive episodes, gaps in time, as well as waking up with near-tongue bites. She has been told her EEG in the past was abnormal, 48-hour EEG and MRI brain normal. Etiology of seizures unclear, she reports doing well on current regimen of Levetiracetam 726m BID and Vimpat 1558mBID, no side effects. She is also on gabapentin 30066mID for nerve pain. Refills sent. She is driving, [place] driving laws discussed, she knows to stop driving after a seizure until 6 months seizure-free. Follow-up in 6-8 months, she knows to call for any changes.    Follow Up Instructions:   -I discussed the assessment and treatment plan with the patient. The patient was provided an opportunity to ask questions and all were answered. The patient agreed with the plan and demonstrated an understanding of the instructions.   The patient was advised to call back or seek an in-person evaluation if the symptoms worsen or if the condition fails to improve as anticipated.    KarCameron SprangD

## 2020-08-09 ENCOUNTER — Other Ambulatory Visit: Payer: Self-pay | Admitting: Student in an Organized Health Care Education/Training Program

## 2020-08-09 DIAGNOSIS — R102 Pelvic and perineal pain: Secondary | ICD-10-CM

## 2020-08-10 ENCOUNTER — Other Ambulatory Visit: Payer: Self-pay | Admitting: Family Medicine

## 2020-08-10 DIAGNOSIS — Z1231 Encounter for screening mammogram for malignant neoplasm of breast: Secondary | ICD-10-CM

## 2020-08-15 ENCOUNTER — Ambulatory Visit
Admission: RE | Admit: 2020-08-15 | Discharge: 2020-08-15 | Disposition: A | Discharge status: Home / Self Care | Payer: Medicare Other | Source: Ambulatory Visit | Attending: Family Medicine | Admitting: Family Medicine

## 2020-08-15 ENCOUNTER — Other Ambulatory Visit: Payer: Self-pay

## 2020-08-15 DIAGNOSIS — Z1231 Encounter for screening mammogram for malignant neoplasm of breast: Secondary | ICD-10-CM

## 2020-08-17 ENCOUNTER — Other Ambulatory Visit: Payer: Self-pay | Admitting: Orthopaedic Surgery

## 2020-08-18 ENCOUNTER — Other Ambulatory Visit: Payer: Self-pay | Admitting: Family Medicine

## 2020-08-18 DIAGNOSIS — R928 Other abnormal and inconclusive findings on diagnostic imaging of breast: Secondary | ICD-10-CM

## 2020-08-23 ENCOUNTER — Other Ambulatory Visit: Payer: Self-pay

## 2020-08-23 ENCOUNTER — Ambulatory Visit
Admission: RE | Admit: 2020-08-23 | Discharge: 2020-08-23 | Disposition: A | Discharge status: Home / Self Care | Payer: Medicare Other | Source: Ambulatory Visit | Attending: Student in an Organized Health Care Education/Training Program | Admitting: Student in an Organized Health Care Education/Training Program

## 2020-08-23 DIAGNOSIS — R102 Pelvic and perineal pain: Secondary | ICD-10-CM

## 2020-09-09 ENCOUNTER — Ambulatory Visit
Admission: RE | Admit: 2020-09-09 | Discharge: 2020-09-09 | Disposition: A | Discharge status: Home / Self Care | Payer: Medicare Other | Source: Ambulatory Visit | Attending: Family Medicine | Admitting: Family Medicine

## 2020-09-09 ENCOUNTER — Other Ambulatory Visit: Payer: Self-pay

## 2020-09-09 ENCOUNTER — Other Ambulatory Visit: Payer: Self-pay | Admitting: Family Medicine

## 2020-09-09 DIAGNOSIS — R928 Other abnormal and inconclusive findings on diagnostic imaging of breast: Secondary | ICD-10-CM

## 2020-10-10 ENCOUNTER — Other Ambulatory Visit: Payer: Self-pay

## 2020-10-10 ENCOUNTER — Other Ambulatory Visit: Payer: Self-pay | Admitting: Neurology

## 2020-10-10 MED ORDER — LACOSAMIDE 150 MG PO TABS: 1.0000 | ORAL_TABLET | Freq: Two times a day (BID) | ORAL | 0 refills | Status: DC

## 2020-11-14 ENCOUNTER — Other Ambulatory Visit: Payer: Self-pay | Admitting: Obstetrics & Gynecology

## 2020-11-22 ENCOUNTER — Other Ambulatory Visit: Payer: Self-pay | Admitting: Obstetrics & Gynecology

## 2020-11-23 ENCOUNTER — Other Ambulatory Visit: Payer: Self-pay | Admitting: Obstetrics & Gynecology

## 2020-11-25 ENCOUNTER — Other Ambulatory Visit: Payer: Self-pay | Admitting: Obstetrics & Gynecology

## 2020-11-25 DIAGNOSIS — R102 Pelvic and perineal pain: Secondary | ICD-10-CM

## 2020-12-02 ENCOUNTER — Ambulatory Visit: Payer: Medicare Other | Admitting: Neurology

## 2020-12-06 ENCOUNTER — Encounter: Payer: Self-pay | Admitting: Neurology

## 2020-12-06 ENCOUNTER — Other Ambulatory Visit: Payer: Self-pay

## 2020-12-06 ENCOUNTER — Ambulatory Visit (INDEPENDENT_AMBULATORY_CARE_PROVIDER_SITE_OTHER): Payer: Medicare Other | Admitting: Neurology

## 2020-12-06 VITALS — BP 121/84 | HR 82 | Ht 63.0 in | Wt 261.4 lb

## 2020-12-06 DIAGNOSIS — G40009 Localization-related (focal) (partial) idiopathic epilepsy and epileptic syndromes with seizures of localized onset, not intractable, without status epilepticus: Secondary | ICD-10-CM | POA: Diagnosis not present

## 2020-12-06 MED ORDER — LEVETIRACETAM 500 MG PO TABS: ORAL_TABLET | ORAL | 3 refills | Status: DC

## 2020-12-06 MED ORDER — LACOSAMIDE 150 MG PO TABS: 1.0000 | ORAL_TABLET | Freq: Two times a day (BID) | ORAL | 3 refills | Status: DC

## 2020-12-06 NOTE — Patient Instructions (Signed)
Good to see you!  Increase the Levetiracetam (Keppra) 500mg : Take 1 and 1/2 tablets twice a day  2. Continue Lacosamide (Vimpat) 150mg  twice a day  3. Keep a calendar of your symptoms  4. Follow-up in 6 months, call for any changes   Seizure Precautions: 1. If medication has been prescribed for you to prevent seizures, take it exactly as directed.  Do not stop taking the medicine without talking to your doctor first, even if you have not had a seizure in a long time.   2. Avoid activities in which a seizure would cause danger to yourself or to others.  Don't operate dangerous machinery, swim alone, or climb in high or dangerous places, such as on ladders, roofs, or girders.  Do not drive unless your doctor says you may.  3. If you have any warning that you may have a seizure, lay down in a safe place where you can't hurt yourself.    4.  No driving for 6 months from last seizure, as per [facility].   Please refer to the following link on the [employer]ile[employer]'s website for more information: http://www.epilepsyfoundation.org/answerplace/Social/driving/drivingu.cfm   5.  Maintain good sleep hygiene. Avoid alcohol.  6.  Contact your doctor if you have any problems that may be related to the medicine you are taking.  7.  Call 911 and bring the patient back to the ED if:        A.  The seizure lasts longer than 5 minutes.       B.  The patient doesn't awaken shortly after the seizure  C.  The patient has new problems such as difficulty seeing, speaking or moving  D.  The patient was injured during the seizure  E.  The patient has a temperature over 102 F (39C)  F.  The patient vomited and now is having trouble breathing

## 2020-12-06 NOTE — Progress Notes (Signed)
NEUROLOGY FOLLOW UP OFFICE NOTE  Helen [name] [id number] 11-03-1965  HISTORY OF PRESENT ILLNESS: I had the pleasure of seeing Helen [name] in follow-up in the neurology clinic on 12/06/2020.  The patient was last seen 8 months ago for seizures. She is accompanied by her aunt who helps supplement the history today.  Records and images were personally reviewed where available. She has an aide who has mentioned 3 episodes of staring in the past 8 months. Her aunt has not seen any staring spells recently. She has woke up 4 times with a tongue bite, last was 4 weeks ago. She notices a sort of metallic smell every once in a while with no associated speech changes or confusion. She has had 3 falls, most recently 2 weeks ago. She is not sure what happened or if she passed out, but was on the floor. She was coming out of the bedroom and her eyes were open but she ran in to a wall because she did not see it. No injuries, she just felt sore. She denies any dizziness or vision changes. She had been instructed to increase her Levetiracetam but reports she has only been taking 517m BID instead of 7520mBID. She is also on Lacosamide 15043mID. She is prescribed gabapentin 300m38mD for nerve pain. She has numbness in her toes and tips of fingers. Sleep is so-so. Mood is good.    History on Initial Assessment 05/26/2019: This is a 53 y21r old right-handed woman with a history of hypertension, diabetes, fibromyalgia, stroke in 1997, presenting for evaluation of seizures. 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 She has been seeing neurologist Dr. RunhTrula Ored when she first started seeing him, her mother reported staring/unresponsive episodes. She reports "there was something showing up on the brain wave test," and she was started on Vimpat and  Keppra at the same time in 2013 or 2014. She is not sure if the episodes quieted down because she is mostly alone at home. She has a CNA coming daily  who tells her she is not responding, last episode was a few days ago. The CNA tells here mostly everyday that something happened. She has occasional episodes of a metallic taste in her mouth. Sometimes she comes to with her teeth down on her tongue, unsure how it happened. No incontinence. She has occasional twitching in her eyes, either leg or arm. One time in church last month, both hands started trembling and she could not stop them. There was a prickly sensation at the tip of her fingers that lasted more than 15 seconds. She could understand things around her but her body felt weird. There was slight dizziness and a hot/cold sensation. She lives with her sister and would be told that she does not answer/hear them. She has gaps in time "all the time." When driving, she does not remember passing certain places. When she laughs too hard, she can get lightheaded and things go black, then she wakes up on the floor. This happened a couple of weeks ago while she was sitting. She thinks she has seizures in her sleep, she wakes up in the morning with her teeth on her tongue. She ran out of Keppra and Vimpat 3 months ago and feels she is having more of the night events.  She  has had headaches for several years with pain on the vertex and right side occurring around twice a week. No associated nausea/vomiting. There are some flickering lights. Tylenol helps. She has intermittent blurred vision that her eye doctor thinks is related to fibromyalgia. She was diagnosed with fibromyalgia in 2006, she has very sensitive skin even to light touch, in constant pain 24/7. When really bad, she needs help eating because she can barely lift her arms up. Her left arm has been in constant severe daily pain the past month, which has affected her sleep. She has been told she has carpal  tunnel syndrome on the left hand 5 years ago. She was getting injections in her neck and back. She takes gabapentin once a day, and is not aware she was told to take it BID. She denies being under a lot of stress.   Epilepsy Risk Factors:  She had a normal birth and early development.  There is no history of febrile convulsions, CNS infections such as meningitis/encephalitis, significant traumatic brain injury, neurosurgical procedures, or family history of seizures.  Diagnostic Data:  MRI brain with and without contrast done 06/2019 which was normal, hippocampi symmetric with no abnormal signal or enhancement seen. Her routine and 48-hour EEG in 05/2019 was normal, however typical events were not captured.    PAST MEDICAL HISTORY: Past Medical History:  Diagnosis Date   Acid reflux    Anemia    Anxiety    Chronic headaches    Degenerative disc disease    lumbar   Diabetes mellitus without complication (HCC)    Fibromyalgia    Heart murmur    Hypertension    Scoliosis    Seizures ([place])    Stroke ([employer]) 1997    MEDICATIONS: Current Outpatient Medications on File Prior to Visit  Medication Sig Dispense Refill   ACCU-CHEK AVIVA PLUS test strip      acetaminophen (TYLENOL) 325 MG tablet Take 2 tablets (650 mg total) by mouth every 6 (six) hours as needed for mild pain, moderate pain or headache. 60 tablet 0   Alcohol Swabs (ALCOHOL PREP) 70 % PADS      ALLERGY RELIEF 10 MG tablet Take 10 mg by mouth daily.     aspirin EC 81 MG tablet Take 1 tablet (81 mg total) by mouth daily. 30 tablet 0   ASSURE COMFORT LANCETS 30G MISC      atorvastatin (LIPITOR) 20 MG tablet Take 20 mg by mouth daily.     Blood Glucose Calibration (ACCU-CHEK AVIVA) SOLN      Blood Glucose Monitoring Suppl (ACCU-CHEK AVIVA PLUS) W/DEVICE KIT      gabapentin (NEURONTIN) 300 MG capsule TAKE 1 CAPSULE BY MOUTH 2 TIMES DAILY 60 capsule 3   Lacosamide 150 MG TABS Take 1 tablet (150 mg total) by mouth 2 (two) times  daily. 180 tablet 0   Lancet Devices (ADJUSTABLE LANCING DEVICE) MISC      levETIRAcetam (KEPPRA) 500 MG tablet Take 1 and 1/2 tablets twice a day 270 tablet 3   nabumetone (RELAFEN) 750 MG tablet TAKE 1 TABLET BY MOUTH 2 TIMES DAILY AS NEEDED 60 tablet 1   olmesartan-hydrochlorothiazide (BENICAR HCT) 20-12.5 MG tablet Take 1 tablet by mouth daily. 90 tablet 1   Travoprost, BAK Free, (TRAVATAN) 0.004 % SOLN ophthalmic solution 1 drop at bedtime.     No current facility-administered medications on file prior to visit.    ALLERGIES: Allergies  Allergen Reactions   Pregabalin Other (See Comments)  Zoned out, talking crazy   Topiramate Other (See Comments)    REACTION: caused "inside seizures"   Metronidazole Rash   Milnacipran Palpitations    FAMILY HISTORY: Family History  Problem Relation Age of Onset   Diabetes Mother    Hyperlipidemia Mother    Heart attack Father    Diabetes Sister    Breast cancer Sister    Diabetes Brother    Breast cancer Paternal Grandmother    Colon cancer Neg Hx     SOCIAL HISTORY: Social History   Socioeconomic History   Marital status: Divorced    Spouse name: Not on file   Number of children: Not on file   Years of education: Not on file   Highest education level: Not on file  Occupational History   Not on file  Tobacco Use   Smoking status: Never   Smokeless tobacco: Never  Substance and Sexual Activity   Alcohol use: No   Drug use: No   Sexual activity: Not on file  Other Topics Concern   Not on file  Social History Narrative   Right handed    One story home few steps   Lives with sister    Social Determinants of Health   Financial Resource Strain: Not on file  Food Insecurity: Not on file  Transportation Needs: Not on file  Physical Activity: Not on file  Stress: Not on file  Social Connections: Not on file  Intimate Partner Violence: Not on file     PHYSICAL EXAM: Vitals:   12/06/20 0855  BP: 121/84  Pulse:  82  SpO2: 99%   General: No acute distress Head:  Normocephalic/atraumatic Skin/Extremities: No rash, no edema Neurological Exam: alert and awake. She has laryngitis and is unable to speak today, communicating by writing or demonstrating with hands. Fund of knowledge is appropriate.  Recent and remote memory are intact.  Attention and concentration are normal.   Cranial nerves: Pupils equal, round. Extraocular movements intact with no nystagmus. Visual fields full.  No facial asymmetry.  Motor: Bulk and tone normal, muscle strength 5/5 throughout with no pronator drift.   Finger to nose testing intact.  Gait narrow-based and steady, no ataxia.   IMPRESSION: This is a 55 yo RH woman with a history of hypertension, diabetes, fibromyalgia, stroke in 1997, and seizures. She describes a metallic taste, staring/unresponsive episodes, gaps in time, as well as waking up with near-tongue bites. She has been told her EEG in the past was abnormal, her 48-hour EEG in 2021 and MRI brain were normal. Etiology of seizures unclear, she continues to report similar symptoms. She did not increase Levetiracetam on last visit, advised to increase to 764m BID. Continue Lacosamide 1572mBID. She is also on gabapentin for neuropathy/nerve pain. She does not drive. Advised to keep a symptom calendar. Follow-up in 6 months, call for any changes.   Thank you for allowing me to participate in her care.  Please do not hesitate to call for any questions or concerns.    KaEllouise NewerM.D.   CC: Dr. RoQuentin Cornwall

## 2020-12-12 ENCOUNTER — Other Ambulatory Visit: Payer: Self-pay | Admitting: Orthopaedic Surgery

## 2020-12-19 ENCOUNTER — Ambulatory Visit
Admission: RE | Admit: 2020-12-19 | Discharge: 2020-12-19 | Disposition: A | Discharge status: Home / Self Care | Payer: Medicare Other | Source: Ambulatory Visit | Attending: Obstetrics & Gynecology | Admitting: Obstetrics & Gynecology

## 2020-12-19 DIAGNOSIS — R102 Pelvic and perineal pain: Secondary | ICD-10-CM

## 2020-12-19 MED ORDER — IOPAMIDOL (ISOVUE-300) INJECTION 61%
100.0000 mL | Freq: Once | INTRAVENOUS | Status: AC | PRN
  Administered 2020-12-19: 100 mL via INTRAVENOUS

## 2021-01-03 ENCOUNTER — Other Ambulatory Visit: Payer: Self-pay | Admitting: Neurology

## 2021-03-16 ENCOUNTER — Other Ambulatory Visit: Payer: Medicare Other

## 2021-04-12 ENCOUNTER — Ambulatory Visit
Admission: RE | Admit: 2021-04-12 | Discharge: 2021-04-12 | Disposition: A | Discharge status: Home / Self Care | Payer: Medicare Other | Source: Ambulatory Visit | Attending: Family Medicine | Admitting: Family Medicine

## 2021-04-12 ENCOUNTER — Other Ambulatory Visit: Payer: Self-pay | Admitting: Family Medicine

## 2021-04-12 DIAGNOSIS — R928 Other abnormal and inconclusive findings on diagnostic imaging of breast: Secondary | ICD-10-CM

## 2021-06-07 ENCOUNTER — Ambulatory Visit: Payer: Medicare Other | Admitting: Neurology

## 2021-06-16 ENCOUNTER — Encounter: Payer: Self-pay | Admitting: Neurology

## 2021-06-16 ENCOUNTER — Other Ambulatory Visit: Payer: Self-pay

## 2021-06-16 ENCOUNTER — Ambulatory Visit (INDEPENDENT_AMBULATORY_CARE_PROVIDER_SITE_OTHER): Payer: Medicare Other | Admitting: Neurology

## 2021-06-16 VITALS — BP 126/73 | HR 103 | Ht 63.5 in | Wt 269.6 lb

## 2021-06-16 DIAGNOSIS — G40009 Localization-related (focal) (partial) idiopathic epilepsy and epileptic syndromes with seizures of localized onset, not intractable, without status epilepticus: Secondary | ICD-10-CM | POA: Diagnosis not present

## 2021-06-16 MED ORDER — LACOSAMIDE 150 MG PO TABS: 1.0000 | ORAL_TABLET | Freq: Two times a day (BID) | ORAL | 3 refills | Status: DC

## 2021-06-16 MED ORDER — LEVETIRACETAM 500 MG PO TABS: ORAL_TABLET | ORAL | 3 refills | Status: DC

## 2021-06-16 NOTE — Progress Notes (Signed)
? ?NEUROLOGY FOLLOW UP OFFICE NOTE ? ?Helen [name] ?[id number] ?November 14, 1965 ? ?HISTORY OF PRESENT ILLNESS: ?I had the pleasure of seeing [name] in follow-up in the neurology clinic on 06/16/2021.  The patient was last seen 6 months ago for seizures. She is alone in the office today. On her last visit, she continued to report episodes of waking up with a tongue bite and metallic taste every once in a while. Levetiracetam dose increased to 736m BID, however she misunderstood and has been taking 5056min AM, 75025mn PM. She is also on Lacosamide 150m80mD. Gabapentin 300mg53m is prescribed by her PCP for nerve pain. She has not had any seizures that she knows of, but recently a couple of nights this week she wakes up with her teeth on her tongue, this happened again after she took a nap recently. She lives with her 2 children (ages [phone] [phone]) w12 have not mentioned any seizures, she has not woken up on the ground. She denies any olfactory/gustatory hallucinations, focal numbness/tingling/weakness, myoclonic jerks. 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 Her children have told her that they would say something and she constantly repeats herself, asking them again. She fell a few months ago and hit the side of the wall.  ? ? ?History on Initial Assessment 05/26/2019: This is a 53 ye68 old right-handed woman with a history of hypertension, diabetes, fibromyalgia, stroke in 1997, presenting for evaluation of seizures. 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 She has been seeing neurologist Dr. RunheTrula Ore when she first started seeing him, her mother reported staring/unresponsive episodes. She reports "there was something showing up on the brain wave test," and she was  started on Vimpat and Keppra at the same time in 2013 or 2014. She is not sure if the episodes quieted down because she is mostly alone at home. She has a CNA coming daily  who tells her she is not responding, last episode was a few days ago. The CNA tells here mostly everyday that something happened. She has occasional episodes of a metallic taste in her mouth. Sometimes she comes to with her teeth down on her tongue, unsure how it happened. No incontinence. She has occasional twitching in her eyes, either leg or arm. One time in church last month, both hands started trembling and she could not stop them. There was a prickly sensation at the tip of her fingers that lasted more than 15 seconds. She could understand things around her but her body felt weird. There was slight dizziness and a hot/cold sensation. She lives with her sister and would be told that she does not answer/hear them. She has gaps in time "all the time." When driving, she does not remember passing certain places. When she laughs too hard, she can get lightheaded and things go black, then she wakes up on the floor. This happened a couple of weeks ago while she was sitting. She thinks she has seizures in her sleep, she wakes up in the morning with her teeth on her tongue. She ran out of Keppra and Vimpat 3 months ago and feels she is having more of the night events. ? ?She has had headaches for several years with pain on  the vertex and right side occurring around twice a week. No associated nausea/vomiting. There are some flickering lights. Tylenol helps. She has intermittent blurred vision that her eye doctor thinks is related to fibromyalgia. She was diagnosed with fibromyalgia in 2006, she has very sensitive skin even to light touch, in constant pain 24/7. When really bad, she needs help eating because she can barely lift her arms up. Her left arm has been in constant severe daily pain the past month, which has affected her sleep. She has been  told she has carpal tunnel syndrome on the left hand 5 years ago. She was getting injections in her neck and back. She takes gabapentin once a day, and is not aware she was told to take it BID. She denies being under a lot of stress.  ? ?Epilepsy Risk Factors:  She had a normal birth and early development.  There is no history of febrile convulsions, CNS infections such as meningitis/encephalitis, significant traumatic brain injury, neurosurgical procedures, or family history of seizures. ? ?Diagnostic Data:  ?MRI brain with and without contrast done 06/2019 which was normal, hippocampi symmetric with no abnormal signal or enhancement seen. ?Her routine and 48-hour EEG in 05/2019 was normal, however typical events were not captured.  ? ? ?PAST MEDICAL HISTORY: ?Past Medical History:  ?Diagnosis Date  ? Acid reflux   ? Anemia   ? Anxiety   ? Chronic headaches   ? Degenerative disc disease   ? lumbar  ? Diabetes mellitus without complication ([place])   ? Fibromyalgia   ? Heart murmur   ? Hypertension   ? Scoliosis   ? Seizures ([place])   ? Stroke [facility]) 1997  ? ? ?MEDICATIONS: ?Current Outpatient Medications on File Prior to Visit  ?Medication Sig Dispense Refill  ? ACCU-CHEK AVIVA PLUS test strip     ? acetaminophen (TYLENOL) 325 MG tablet Take 2 tablets (650 mg total) by mouth every 6 (six) hours as needed for mild pain, moderate pain or headache. 60 tablet 0  ? Alcohol Swabs (ALCOHOL PREP) 70 % PADS     ? ALLERGY RELIEF 10 MG tablet Take 10 mg by mouth daily.    ? aspirin EC 81 MG tablet Take 1 tablet (81 mg total) by mouth daily. 30 tablet 0  ? ASSURE COMFORT LANCETS 30G MISC     ? atorvastatin (LIPITOR) 20 MG tablet Take 20 mg by mouth daily.    ? Blood Glucose Calibration (ACCU-CHEK AVIVA) SOLN     ? Blood Glucose Monitoring Suppl (ACCU-CHEK AVIVA PLUS) W/DEVICE KIT     ? gabapentin (NEURONTIN) 300 MG capsule TAKE 1 CAPSULE BY MOUTH 2 TIMES DAILY 60 capsule 3  ? Lacosamide 150 MG TABS Take 1 tablet (150 mg total) by  mouth 2 (two) times daily. 180 tablet 3  ? Lancet Devices (ADJUSTABLE LANCING DEVICE) MISC     ? levETIRAcetam (KEPPRA) 500 MG tablet Take 1 and 1/2 tablets twice a day 270 tablet 3  ? nabumetone (RELAFEN) 750 MG tablet TAKE 1 TABLET BY MOUTH 2 TIMES DAILY AS NEEDED 60 tablet 1  ? olmesartan-hydrochlorothiazide (BENICAR HCT) 20-12.5 MG tablet Take 1 tablet by mouth daily. 90 tablet 1  ? Travoprost, BAK Free, (TRAVATAN) 0.004 % SOLN ophthalmic solution 1 drop at bedtime.    ? ?No current facility-administered medications on file prior to visit.  ? ? ?ALLERGIES: ?Allergies  ?Allergen Reactions  ? Pregabalin Other (See Comments)  ?  Zoned out, talking crazy  ?  Topiramate Other (See Comments)  ?  REACTION: caused "inside seizures"  ? Metronidazole Rash  ? Milnacipran Palpitations  ? ? ?FAMILY HISTORY: ?Family History  ?Problem Relation Age of Onset  ? Diabetes Mother   ? Hyperlipidemia Mother   ? Heart attack Father   ? Diabetes Sister   ? Breast cancer Sister   ? Diabetes Brother   ? Breast cancer Paternal Grandmother   ? Colon cancer Neg Hx   ? ? ?SOCIAL HISTORY: ?Social History  ? ?Socioeconomic History  ? Marital status: Divorced  ?  Spouse name: Not on file  ? Number of children: Not on file  ? Years of education: Not on file  ? Highest education level: Not on file  ?Occupational History  ? Not on file  ?Tobacco Use  ? Smoking status: Never  ? Smokeless tobacco: Never  ?Vaping Use  ? Vaping Use: Never used  ?Substance and Sexual Activity  ? Alcohol use: No  ? Drug use: No  ? Sexual activity: Not on file  ?Other Topics Concern  ? Not on file  ?Social History Narrative  ? Right handed   ? One story home few steps  ? Lives with sister   ? ?Social Determinants of Health  ? ?Financial Resource Strain: Not on file  ?Food Insecurity: Not on file  ?Transportation Needs: Not on file  ?Physical Activity: Not on file  ?Stress: Not on file  ?Social Connections: Not on file  ?Intimate Partner Violence: Not on file   ? ? ? ?PHYSICAL EXAM: ?Vitals:  ? 06/16/21 1528  ?BP: 126/73  ?Pulse: (!) 103  ?SpO2: 97%  ? ?General: No acute distress ?Head:  Normocephalic/atraumatic ?Skin/Extremities: No rash, no edema ?Neurological Exam: alert and a

## 2021-06-16 NOTE — Patient Instructions (Signed)
Increase Levetiracetam (Keppra) 500mg : take 1 and 1/2 tablets in AM, 1 and 1/2 tablets in PM ? ?2. Continue Lacosamide 150mg  twice a day ? ?3. Keep a calendar of your symptoms.  ? ?4. Follow-up in 6 months, call for any changes ? ? ?Seizure Precautions: ?1. If medication has been prescribed for you to prevent seizures, take it exactly as directed.  Do not stop taking the medicine without talking to your doctor first, even if you have not had a seizure in a long time.  ? ?2. Avoid activities in which a seizure would cause danger to yourself or to others.  Don't operate dangerous machinery, swim alone, or climb in high or dangerous places, such as on ladders, roofs, or girders.  Do not drive unless your doctor says you may. ? ?3. If you have any warning that you may have a seizure, lay down in a safe place where you can't hurt yourself.   ? ?4.  No driving for 6 months from last seizure, as per [facility].   Please refer to the following link on the [place] website for more information: http://www.epilepsyfoundation.org/answerplace/Social/driving/drivingu.cfm  ? ?5.  Maintain good sleep hygiene. Avoid alcohol. ? ?6.  Contact your doctor if you have any problems that may be related to the medicine you are taking. ? ?7.  Call 911 and bring the patient back to the ED if: ?      ? A.  The seizure lasts longer than 5 minutes.      ? B.  The patient doesn't awaken shortly after the seizure ? C.  The patient has new problems such as difficulty seeing, speaking or moving ? D.  The patient was injured during the seizure ? E.  The patient has a temperature over 102 F (39C) ? F.  The patient vomited and now is having trouble breathing ?      ? ?

## 2021-10-11 ENCOUNTER — Ambulatory Visit
Admission: RE | Admit: 2021-10-11 | Discharge: 2021-10-11 | Disposition: A | Discharge status: Home / Self Care | Payer: Medicare Other | Source: Ambulatory Visit | Attending: Family Medicine | Admitting: Family Medicine

## 2021-10-11 ENCOUNTER — Other Ambulatory Visit: Payer: Self-pay | Admitting: Family Medicine

## 2021-10-11 DIAGNOSIS — N6311 Unspecified lump in the right breast, upper outer quadrant: Secondary | ICD-10-CM

## 2021-10-11 DIAGNOSIS — R928 Other abnormal and inconclusive findings on diagnostic imaging of breast: Secondary | ICD-10-CM

## 2021-10-13 ENCOUNTER — Ambulatory Visit
Admission: RE | Admit: 2021-10-13 | Discharge: 2021-10-13 | Disposition: A | Discharge status: Home / Self Care | Payer: Medicare Other | Source: Ambulatory Visit | Attending: Family Medicine | Admitting: Family Medicine

## 2021-10-13 ENCOUNTER — Other Ambulatory Visit: Payer: Self-pay | Admitting: Family Medicine

## 2021-10-13 DIAGNOSIS — N6311 Unspecified lump in the right breast, upper outer quadrant: Secondary | ICD-10-CM

## 2021-10-28 IMAGING — MR MR HEAD WO/W CM
12 series · 48 of 48 positions shown · IV contrast (multihance)
Comparison: MRI head 12/12/2015

CLINICAL DATA: Seizure disorder

Creatinine was obtained on site at [HOSPITAL] at [HOSPITAL].
Results: Creatinine 0.8 mg/dL.
EXAM:
MRI HEAD WITHOUT AND WITH CONTRAST
TECHNIQUE: Multiplanar, multiecho pulse sequences of the brain and surrounding
structures were obtained without and with intravenous contrast.
CONTRAST:  20mL MULTIHANCE GADOBENATE DIMEGLUMINE 529 MG/ML IV SOLN

[Series 5: T1 · sagittal · 4.0mm · 0.75mm/px · 1 of 27 slices shown (1 of 3)]
[im 1/27]
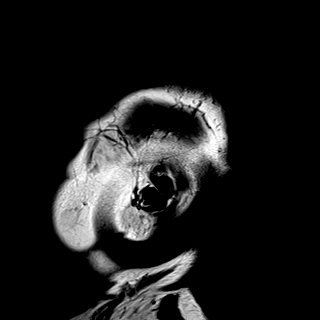

[Series 6: DWI · axial · 3.0mm · 1.44mm/px · z∈[-46,+88]mm · 5 of 84 slices shown (1 of 2)]
[im 1/84]
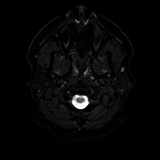
[im 21/84]
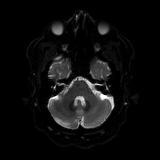
[im 42/84]
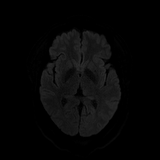
[im 63/84]
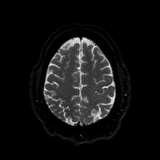
[im 84/84]
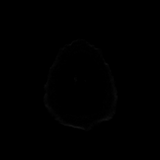

[Series 7: DWI · axial · 3.0mm · 1.44mm/px · z∈[-46,+88]mm · 3 of 42 slices shown (2 of 2)]
[im 1/42]
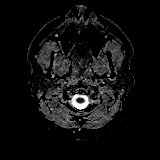
[im 21/42]
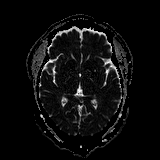
[im 42/42]
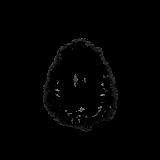

[Series 8: T2 · axial · 4.0mm · 0.36mm/px · z∈[-47,+87]mm · 2 of 27 slices shown (1 of 2)]
[im 1/27]
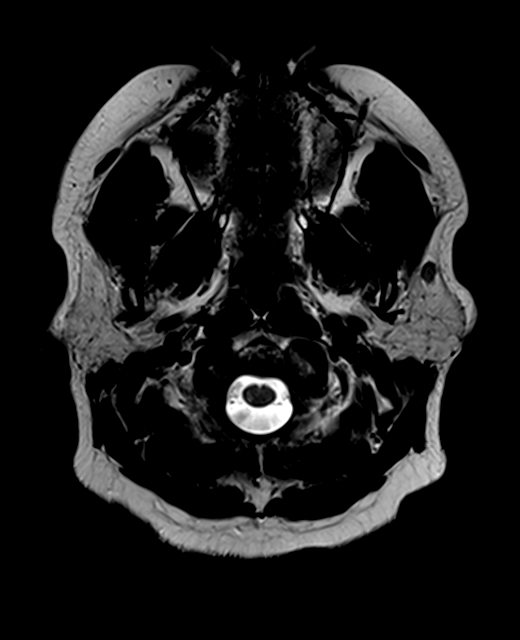
[im 27/27]
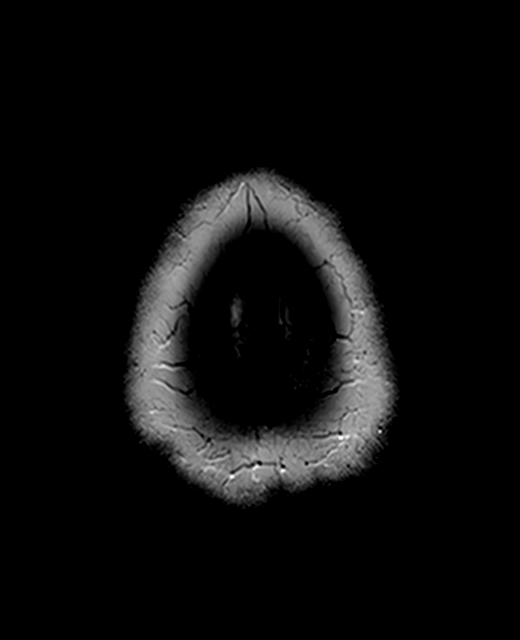

[Series 9: FLAIR · axial · 3.0mm · 0.72mm/px · z∈[-44,+86]mm · 2 of 26 slices shown (1 of 2)]
[im 1/26]
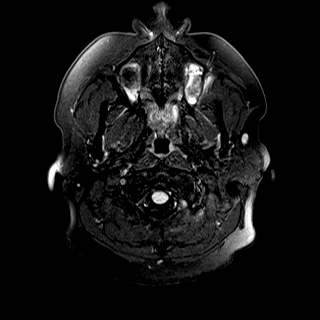
[im 26/26]
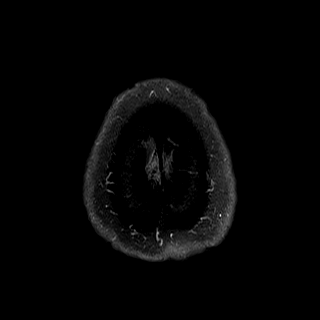

[Series 11: swi_images · axial · 2.0mm · 0.90mm/px · z∈[-51,+91]mm · 5 of 72 slices shown]
[im 1/72]
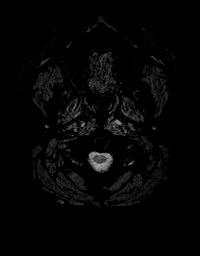
[im 18/72]
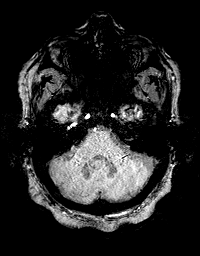
[im 36/72]
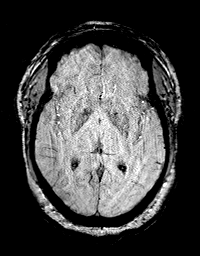
[im 54/72]
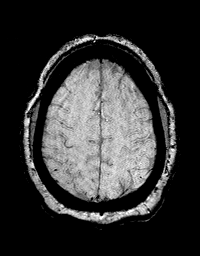
[im 72/72]
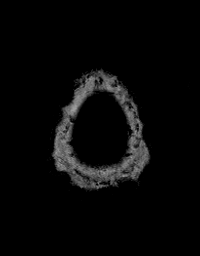

[Series 12: T1 · axial · 1.0mm · 0.94mm/px · z∈[-59,+99]mm · 11 of 160 slices shown (2 of 3)]
[im 1/160]
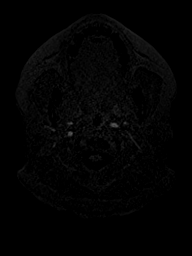
[im 16/160]
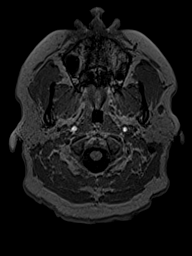
[im 32/160]
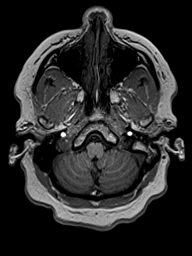
[im 48/160]
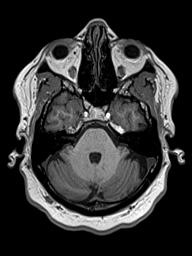
[im 64/160]
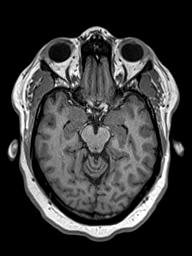
[im 80/160]
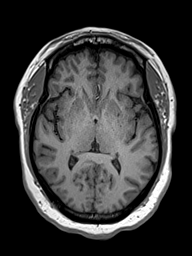
[im 96/160]
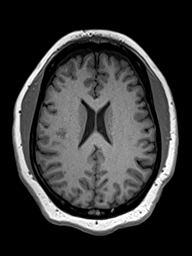
[im 112/160]
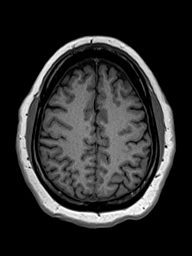
[im 128/160]
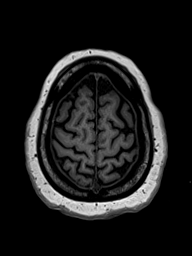
[im 144/160]
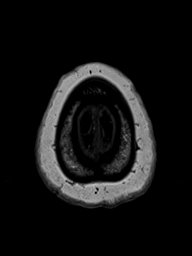
[im 160/160]
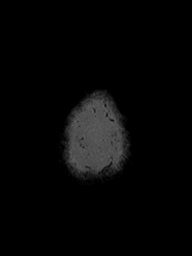

[Series 13: T2 · coronal · 3.0mm · 0.47mm/px · 2 of 25 slices shown (2 of 2)]
[im 1/25]
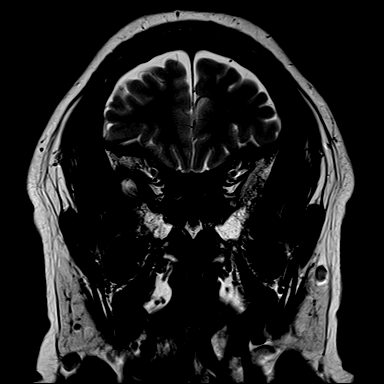
[im 25/25]
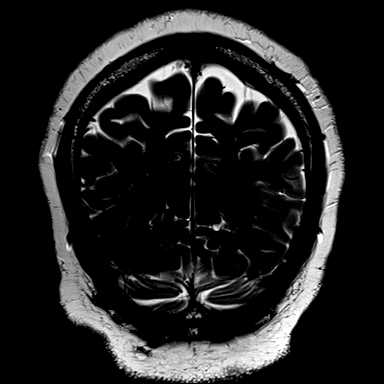

[Series 14: FLAIR · coronal · 3.0mm · 0.56mm/px · 2 of 25 slices shown (2 of 2)]
[im 1/25]
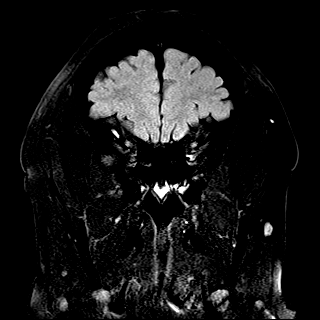
[im 25/25]
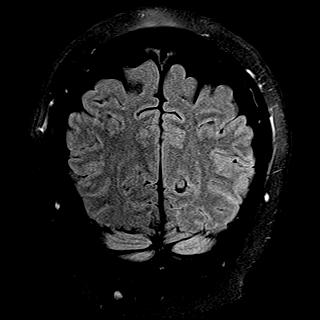

[Series 15: T2 post-contrast · coronal · 4.0mm · 0.36mm/px · 2 of 29 slices shown]
[im 1/29]
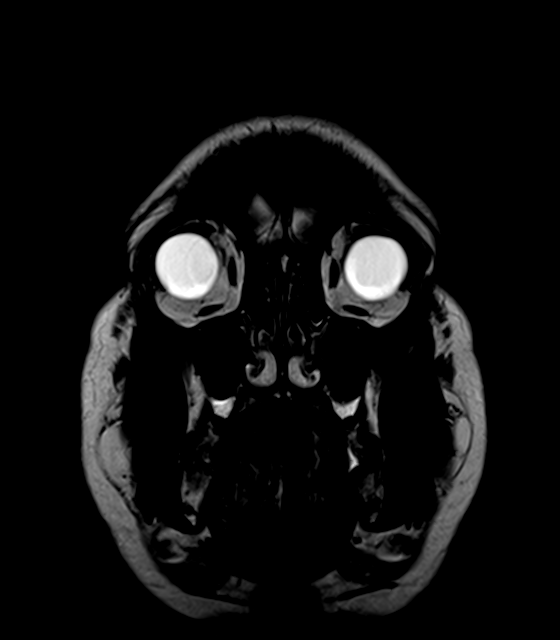
[im 29/29]
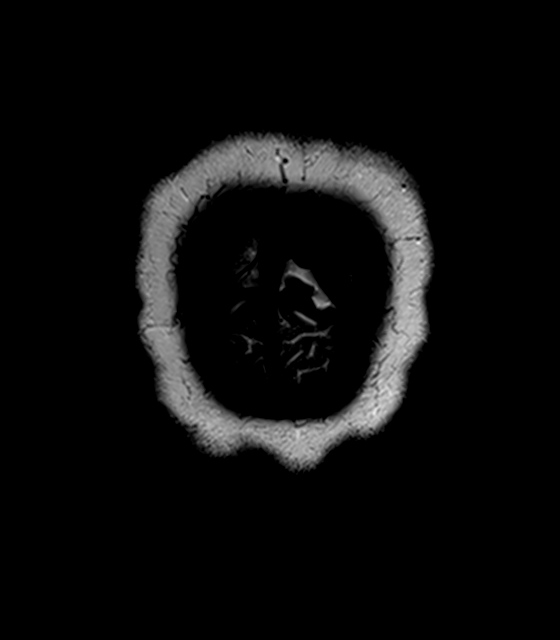

[Series 16: T1 · axial · 1.0mm · 0.94mm/px · z∈[-59,+99]mm · 11 of 160 slices shown (3 of 3)]
[im 1/160]
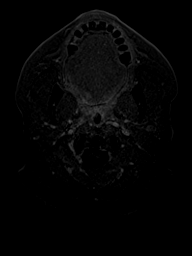
[im 16/160]
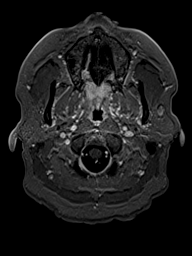
[im 32/160]
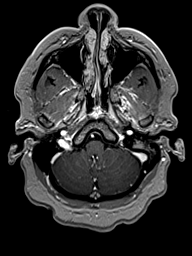
[im 48/160]
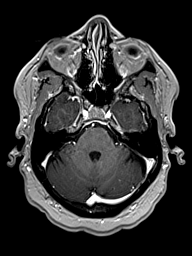
[im 64/160]
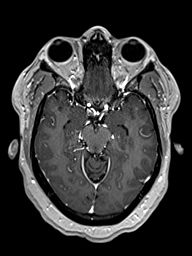
[im 80/160]
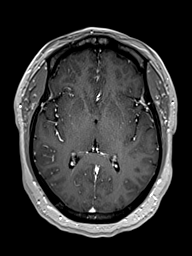
[im 96/160]
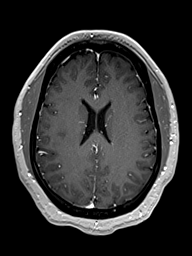
[im 112/160]
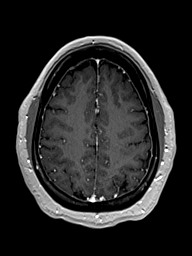
[im 128/160]
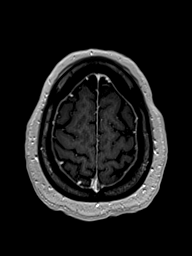
[im 144/160]
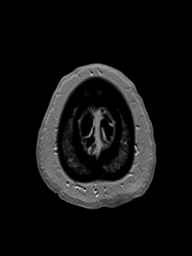
[im 160/160]
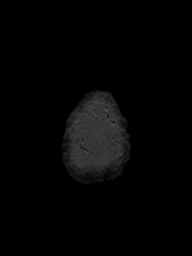

[Series 17: T1 post-contrast · coronal · 4.0mm · 0.72mm/px · 2 of 29 slices shown]
[im 1/29]
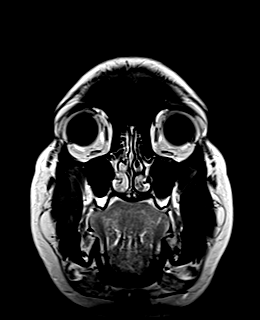
[im 29/29]
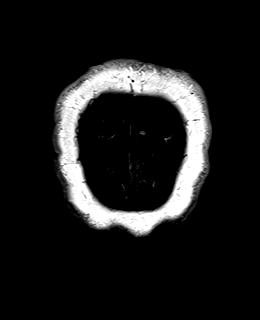

[48 of 48 positions shown; findings below may reference images not displayed]

FINDINGS: Brain: No acute infarction, hemorrhage, hydrocephalus, extra-axial
collection or mass lesion. Normal white matter

Normal enhancement postcontrast administration.

Vascular: Normal arterial flow voids.

Skull and upper cervical spine: No acute skeletal abnormality.

Sinuses/Orbits: Negative

Other: None
IMPRESSION: Negative MRI brain with contrast.

## 2022-01-02 ENCOUNTER — Ambulatory Visit: Payer: Medicare Other | Admitting: Neurology

## 2022-04-26 ENCOUNTER — Other Ambulatory Visit: Payer: Self-pay | Admitting: Neurology

## 2022-06-11 ENCOUNTER — Encounter: Payer: Self-pay | Admitting: Neurology

## 2022-06-11 ENCOUNTER — Ambulatory Visit (INDEPENDENT_AMBULATORY_CARE_PROVIDER_SITE_OTHER): Payer: 59 | Admitting: Neurology

## 2022-06-11 VITALS — BP 96/64 | HR 85 | Ht 63.5 in | Wt 257.6 lb

## 2022-06-11 DIAGNOSIS — G40009 Localization-related (focal) (partial) idiopathic epilepsy and epileptic syndromes with seizures of localized onset, not intractable, without status epilepticus: Secondary | ICD-10-CM

## 2022-06-11 MED ORDER — LEVETIRACETAM 500 MG PO TABS: ORAL_TABLET | ORAL | 3 refills | Status: DC

## 2022-06-11 MED ORDER — LACOSAMIDE 150 MG PO TABS: 1.0000 | ORAL_TABLET | Freq: Two times a day (BID) | ORAL | 3 refills | Status: DC

## 2022-06-11 NOTE — Progress Notes (Signed)
NEUROLOGY FOLLOW UP OFFICE NOTE  Helen [name] SA:[id number] 06-29-1965  HISTORY OF PRESENT ILLNESS: I had the pleasure of seeing Helen [name] in follow-up in the neurology clinic on 06/11/2022.  The patient was last seen a year ago for seizures. She is alone in the office today. Records and images were personally reviewed where available.  Since her last visit, she reports doing well for the past year until a week ago when her nurses aide who comes daily mentioned she "kind of spaced out." Aide reported she was staring off and not answering like she did not hear her. She does not remember this. She notes that sometimes she is watching TV and does not remember certain parts of it. She lives with her adult children who have not mentioned any staring episodes. She denies waking up on the ground. There was only one time last month when she woke up and felt like she was biting the sides of her tongue. She denies any olfactory/gustatory hallucinations, focal numbness/tingling/weakness, myoclonic jerks. No headaches, dizziness, vision changes, no falls. Her glucose levels have been great, she is also happy to report she has lost weight. She denies any side effects on Levetiracetam 750mg  BID (500mg  1.5 tabs BID) and Lacosamide 150mg  BID. She works part-time 3 days a week at [employer]. She is driving.   History on Initial Assessment 05/26/2019: This is a 57 year old right-handed woman with a history of hypertension, diabetes, fibromyalgia, stroke in 1997, presenting for evaluation of seizures. 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 She has been seeing neurologist Dr. Trula Ore, and when she first started seeing him, her mother reported staring/unresponsive episodes. She reports "there was something showing up on the brain wave test," and she  was started on Vimpat and Keppra at the same time in 2013 or 2014. She is not sure if the episodes quieted down because she is mostly alone at home. She has a CNA coming daily  who tells her she is not responding, last episode was a few days ago. The CNA tells here mostly everyday that something happened. She has occasional episodes of a metallic taste in her mouth. Sometimes she comes to with her teeth down on her tongue, unsure how it happened. No incontinence. She has occasional twitching in her eyes, either leg or arm. One time in church last month, both hands started trembling and she could not stop them. There was a prickly sensation at the tip of her fingers that lasted more than 15 seconds. She could understand things around her but her body felt weird. There was slight dizziness and a hot/cold sensation. She lives with her sister and would be told that she does not answer/hear them. She has gaps in time "all the time." When driving, she does not remember passing certain places. When she laughs too hard, she can get lightheaded and things go black, then she wakes up on the floor. This happened a couple of weeks ago while she was sitting. She thinks she has seizures in her sleep, she wakes up in the morning with her teeth on her tongue. She ran out of Keppra and Vimpat 3 months ago and feels she is having more of the night events.  She has had headaches for several years with pain on the vertex and right  side occurring around twice a week. No associated nausea/vomiting. There are some flickering lights. Tylenol helps. She has intermittent blurred vision that her eye doctor thinks is related to fibromyalgia. She was diagnosed with fibromyalgia in 2006, she has very sensitive skin even to light touch, in constant pain 24/7. When really bad, she needs help eating because she can barely lift her arms up. Her left arm has been in constant severe daily pain the past month, which has affected her sleep. She has  been told she has carpal tunnel syndrome on the left hand 5 years ago. She was getting injections in her neck and back. She takes gabapentin once a day, and is not aware she was told to take it BID. She denies being under a lot of stress.   Epilepsy Risk Factors:  She had a normal birth and early development.  There is no history of febrile convulsions, CNS infections such as meningitis/encephalitis, significant traumatic brain injury, neurosurgical procedures, or family history of seizures.  Diagnostic Data:  MRI brain with and without contrast done 06/2019 which was normal, hippocampi symmetric with no abnormal signal or enhancement seen. Her routine and 48-hour EEG in 05/2019 was normal, however typical events were not captured.     PAST MEDICAL HISTORY: Past Medical History:  Diagnosis Date   Acid reflux    Anemia    Anxiety    Chronic headaches    Degenerative disc disease    lumbar   Diabetes mellitus without complication (HCC)    Fibromyalgia    Heart murmur    Hypertension    Scoliosis    Seizures ([place])    Stroke ([place]) 1997    MEDICATIONS: Current Outpatient Medications on File Prior to Visit  Medication Sig Dispense Refill   ACCU-CHEK AVIVA PLUS test strip      acetaminophen (TYLENOL) 325 MG tablet Take 2 tablets (650 mg total) by mouth every 6 (six) hours as needed for mild pain, moderate pain or headache. 60 tablet 0   Alcohol Swabs (ALCOHOL PREP) 70 % PADS      aspirin EC 81 MG tablet Take 1 tablet (81 mg total) by mouth daily. 30 tablet 0   ASSURE COMFORT LANCETS 30G MISC      atorvastatin (LIPITOR) 20 MG tablet Take 20 mg by mouth daily.     Blood Glucose Calibration (ACCU-CHEK AVIVA) SOLN      Blood Glucose Monitoring Suppl (ACCU-CHEK AVIVA PLUS) W/DEVICE KIT      gabapentin (NEURONTIN) 300 MG capsule TAKE 1 CAPSULE BY MOUTH 2 TIMES DAILY 60 capsule 3   HUMALOG MIX 75/25 KWIKPEN (75-25) 100 UNIT/ML KwikPen Inject into the skin. 20 units  in the am and 10 units at  night     Lacosamide 150 MG TABS TAKE 1 TABLET BY MOUTH 2 TIMES DAILY 180 tablet 3   Lancet Devices (ADJUSTABLE LANCING DEVICE) MISC      levETIRAcetam (KEPPRA) 500 MG tablet Take 1 and 1/2 tablets twice a day 270 tablet 3   olmesartan-hydrochlorothiazide (BENICAR HCT) 20-12.5 MG tablet Take 1 tablet by mouth daily. 90 tablet 1   Travoprost, BAK Free, (TRAVATAN) 0.004 % SOLN ophthalmic solution 1 drop at bedtime.     TRULICITY [id number] SOPN Inject 0.75 mg into the skin once a week.     ALLERGY RELIEF 10 MG tablet Take 10 mg by mouth daily. (Patient not taking: Reported on 06/11/2022)     No current facility-administered medications on file prior to visit.  ALLERGIES: Allergies  Allergen Reactions   Pregabalin Other (See Comments)    Zoned out, talking crazy   Topiramate Other (See Comments)    REACTION: caused "inside seizures"   Carbamazepine    Metronidazole Rash   Milnacipran Palpitations    FAMILY HISTORY: Family History  Problem Relation Age of Onset   Diabetes Mother    Hyperlipidemia Mother    Heart attack Father    Diabetes Sister    Breast cancer Sister    Diabetes Brother    Breast cancer Paternal Grandmother    Colon cancer Neg Hx     SOCIAL HISTORY: Social History   Socioeconomic History   Marital status: Divorced    Spouse name: Not on file   Number of children: Not on file   Years of education: Not on file   Highest education level: Not on file  Occupational History   Not on file  Tobacco Use   Smoking status: Never   Smokeless tobacco: Never  Vaping Use   Vaping Use: Never used  Substance and Sexual Activity   Alcohol use: No   Drug use: No   Sexual activity: Not on file  Other Topics Concern   Not on file  Social History Narrative   Right handed    One story home few steps   Lives with sister    Social Determinants of Health   Financial Resource Strain: Not on file  Food Insecurity: Not on file  Transportation Needs: Not on  file  Physical Activity: Not on file  Stress: Not on file  Social Connections: Not on file  Intimate Partner Violence: Not on file     PHYSICAL EXAM: Vitals:   06/11/22 1400  BP: 96/64  Pulse: 85  SpO2: 98%   General: No acute distress Head:  Normocephalic/atraumatic Skin/Extremities: No rash, no edema Neurological Exam: alert and awake. No aphasia or dysarthria. Fund of knowledge is appropriate.   Attention and concentration are normal.   Cranial nerves: Pupils equal, round. Extraocular movements intact with no nystagmus. Visual fields full.  No facial asymmetry.  Motor: Bulk and tone normal, muscle strength 5/5 throughout with no pronator drift.   Finger to nose testing intact.  Gait narrow-based and steady, able to tandem walk adequately.  Romberg negative.   IMPRESSION: This is a 57 yo RH woman with a history of hypertension, diabetes, fibromyalgia, stroke in 1997, and seizures. She describes a metallic taste, staring/unresponsive episodes, gaps in time, as well as waking up with near-tongue bites. She has been told her EEG in the past was abnormal, her 48-hour EEG in 2021 and MRI brain were normal. Etiology of seizures unclear. She reports doing well this past year except for an incident last week where nurses aide reported she was staring/unresponsive. Family has not mentioned similar concerns. Patient will speak to family and aide to monitor symptoms closely, take a video if able. Continue Lacosamide 150mg  BID and Levetiracetam 500mg  1.5 tabs BID (750mg  BID). She is aware of [place] driving laws to stop driving after a seizure until 6 months seizure-free. Follow-up in 6 months, call for any changes.   Thank you for allowing me to participate in her care.  Please do not hesitate to call for any questions or concerns.    Ellouise Newer, M.D.   CC: Dr. Quentin Cornwall

## 2022-06-11 NOTE — Patient Instructions (Addendum)
Always good to see you. Have a great birthday!  Please ask aide and family to keep an eye and monitor for any episodes of not responding  2. Continue Lacosamide 150mg  twice a day and Levetiracetam 500mg : take 1 and 1/2 tablets twice a day  3. Follow-up in 6 months, call for any changes   Seizure Precautions: 1. If medication has been prescribed for you to prevent seizures, take it exactly as directed.  Do not stop taking the medicine without talking to your doctor first, even if you have not had a seizure in a long time.   2. Avoid activities in which a seizure would cause danger to yourself or to others.  Don't operate dangerous machinery, swim alone, or climb in high or dangerous places, such as on ladders, roofs, or girders.  Do not drive unless your doctor says you may.  3. If you have any warning that you may have a seizure, lay down in a safe place where you can't hurt yourself.    4.  No driving for 6 months from last seizure, as per [facility].   Please refer to the following link on the [place] website for more information: http://www.epilepsyfoundation.org/answerplace/Social/driving/drivingu.cfm   5.  Maintain good sleep hygiene. Avoid alcohol.  6.  Contact your doctor if you have any problems that may be related to the medicine you are taking.  7.  Call 911 and bring the patient back to the ED if:        A.  The seizure lasts longer than 5 minutes.       B.  The patient doesn't awaken shortly after the seizure  C.  The patient has new problems such as difficulty seeing, speaking or moving  D.  The patient was injured during the seizure  E.  The patient has a temperature over 102 F (39C)  F.  The patient vomited and now is having trouble breathing

## 2022-09-29 ENCOUNTER — Ambulatory Visit (HOSPITAL_COMMUNITY)
Admission: EM | Admit: 2022-09-29 | Discharge: 2022-09-29 | Disposition: A | Discharge status: Home / Self Care | Payer: 59

## 2022-09-29 ENCOUNTER — Encounter (HOSPITAL_COMMUNITY): Payer: Self-pay | Admitting: *Deleted

## 2022-09-29 ENCOUNTER — Other Ambulatory Visit: Payer: Self-pay

## 2022-09-29 DIAGNOSIS — Z711 Person with feared health complaint in whom no diagnosis is made: Secondary | ICD-10-CM | POA: Diagnosis not present

## 2022-09-29 DIAGNOSIS — Z9189 Other specified personal risk factors, not elsewhere classified: Secondary | ICD-10-CM | POA: Diagnosis not present

## 2022-09-29 NOTE — ED Triage Notes (Signed)
Pt reports her Daughter has recently been DX with HSV1 . Daughter lives with PT. Pt has accidentally used Daughters tooth brush. Pt wants to be tested to see if she has is positive for HSV1.

## 2022-09-29 NOTE — Discharge Instructions (Addendum)
Please schedule an appointment with your primary care provider to discuss further precautions regarding recent possible exposure to hepatitis A virus.  If you develop abdominal pain, fever, chills, abnormal bleeding, or any new symptoms, please return for evaluation.

## 2022-09-29 NOTE — ED Provider Notes (Signed)
[facility]    CSN: [id number] Arrival date & time: 09/29/22  1133      History   Chief Complaint Chief Complaint  Patient presents with   Labs Only    HPI Helen [name] is a 57 y.o. female.   Patient presents to urgent care for testing for evaluation and possible testing for hepatitis a and HSV-1 after finding out that her daughter recently tested positive for these 2 illnesses.  Yesterday, her daughter told her that she tested positive for HSV-1 and hepatitis A in the blood in April 2024.  Patient accidentally used her daughter's toothbrush yesterday and is now concerned that she may have been exposed to hepatitis a and HSV-1.  She is unsure if she has been vaccinated for hepatitis A as a child in the past.  She is concerned because her daughter cooks a lot of the food and they share many household items together.  Patient is without complaint and states she "feels fine" but would like to be screened.  She does not have any sores on her mouth and denies abdominal pain, nausea, vomiting, diarrhea, fever, chills, fatigue, and bodyaches.  She does have a primary care provider.     Past Medical History:  Diagnosis Date   Acid reflux    Anemia    Anxiety    Chronic headaches    Degenerative disc disease    lumbar   Diabetes mellitus without complication (HCC)    Fibromyalgia    Heart murmur    Hypertension    Scoliosis    Seizures (HCC)    Stroke [facility]) 1997    Patient Active Problem List   Diagnosis Date Noted   HLD (hyperlipidemia) 11/05/2014   Essential hypertension 11/05/2014   Numbness 08/04/2014   H/O fibromyalgia 08/04/2014   Left upper extremity numbness    Numbness of left lower extremity    UTI 07/01/2008   DIARRHEA, ACUTE 06/11/2008   GERD 05/28/2008   PERSISTENT DISORDER INITIATING/MAINTAINING SLEEP 04/01/2008   STROKE 04/01/2008   HEADACHE, CHRONIC 04/01/2008   Morbid obesity (HCC) 03/16/2008   BACK PAIN 12/17/2007   Sleep apnea 12/17/2007    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      Home Medications    Prior to Admission medications   Medication Sig Start Date End Date Taking? Authorizing Provider  ACCU-CHEK AVIVA PLUS test strip  06/29/14  Yes [provider]  acetaminophen (TYLENOL) 325 MG tablet Take 2 tablets (650 mg total) by mouth every 6 (six) hours as needed for mild pain, moderate pain or headache. 04/02/17  Yes Everlene Farrier, PA-C  Alcohol Swabs (ALCOHOL PREP) 70 % PADS  06/29/14  Yes [provider]  ALLERGY RELIEF 10 MG tablet Take 10 mg by mouth daily. 02/28/19  Yes [provider]  aspirin EC 81 MG tablet Take 1 tablet (81 mg total) by mouth daily. 08/05/14  Yes Regalado, Prentiss Bells, MD  ASSURE COMFORT LANCETS 30G MISC  06/29/14  Yes [provider]  atorvastatin (LIPITOR) 20 MG tablet Take 20 mg by mouth daily. 04/02/19  Yes [provider]  Blood Glucose Calibration (ACCU-CHEK AVIVA) SOLN  06/29/14  Yes [provider]  Blood Glucose  Monitoring Suppl (ACCU-CHEK AVIVA PLUS) W/DEVICE KIT  06/29/14  Yes [provider]  gabapentin (NEURONTIN) 300 MG capsule TAKE 1 CAPSULE BY MOUTH 2 TIMES DAILY 08/17/20  Yes Kathryne Hitch, MD  HUMALOG MIX 75/25 KWIKPEN (75-25) 100 UNIT/ML KwikPen Inject into the skin. 20 units  in the am and 10 units at night 05/25/21  Yes [provider]  Lacosamide 150 MG TABS Take 1 tablet (150 mg total) by mouth 2 (two) times daily. 06/11/22  Yes Van Clines, MD  Lancet Devices (ADJUSTABLE LANCING DEVICE) MISC  06/29/14  Yes [provider]  levETIRAcetam (KEPPRA) 500 MG tablet Take 1 and 1/2 tablets twice a day 06/11/22  Yes Van Clines, MD   olmesartan-hydrochlorothiazide (BENICAR HCT) 20-12.5 MG tablet Take 1 tablet by mouth daily. 08/27/18  Yes Yates Decamp, MD  Travoprost, BAK Free, (TRAVATAN) 0.004 % SOLN ophthalmic solution 1 drop at bedtime. 05/11/19  Yes [provider]  TRULICITY 0.75 MG/0.5ML SOPN Inject 0.75 mg into the skin once a week.   Yes [provider]    Family History Family History  Problem Relation Age of Onset   Diabetes Mother    Hyperlipidemia Mother    Heart attack Father    Diabetes Sister    Breast cancer Sister    Diabetes Brother    Breast cancer Paternal Grandmother    Colon cancer Neg Hx     Social History Social History   Tobacco Use   Smoking status: Never   Smokeless tobacco: Never  Vaping Use   Vaping Use: Never used  Substance Use Topics   Alcohol use: No   Drug use: No     Allergies   Pregabalin, Topiramate, Carbamazepine, Metronidazole, and Milnacipran   Review of Systems Review of Systems Per HPI  Physical Exam Triage Vital Signs ED Triage Vitals  Enc Vitals Group     BP 09/29/22 1151 111/75     Pulse Rate 09/29/22 1151 71     Resp 09/29/22 1151 18     Temp 09/29/22 1151 97.8 F (36.6 C)     Temp src --      SpO2 09/29/22 1151 93 %     Weight --      Height --      Head Circumference --      Peak Flow --      Pain Score 09/29/22 1149 0     Pain Loc --      Pain Edu? --      Excl. in GC? --    No data found.  Updated Vital Signs BP 111/75   Pulse 71   Temp 97.8 F (36.6 C)   Resp 18   SpO2 93%   Visual Acuity Right Eye Distance:   Left Eye Distance:   Bilateral Distance:    Right Eye Near:   Left Eye Near:    Bilateral Near:     Physical Exam Vitals and nursing note reviewed.  Constitutional:      Appearance: She is not ill-appearing or toxic-appearing.  HENT:     Head: Normocephalic and atraumatic.     Right Ear: Hearing and external ear normal.     Left Ear: Hearing and external ear normal.     Nose: Nose normal.      Mouth/Throat:     Lips: Pink.  Eyes:     General: Lids are normal. Vision grossly intact. Gaze aligned appropriately.     Extraocular Movements:  Extraocular movements intact.     Conjunctiva/sclera: Conjunctivae normal.  Pulmonary:     Effort: Pulmonary effort is normal.  Musculoskeletal:     Cervical back: Neck supple.  Skin:    General: Skin is warm and dry.     Capillary Refill: Capillary refill takes less than 2 seconds.     Findings: No rash.  Neurological:     General: No focal deficit present.     Mental Status: She is alert and oriented to person, place, and time. Mental status is at baseline.     Cranial Nerves: No dysarthria or facial asymmetry.  Psychiatric:        Mood and Affect: Mood normal.        Speech: Speech normal.        Behavior: Behavior normal.        Thought Content: Thought content normal.        Judgment: Judgment normal.      UC Treatments / Results  Labs (all labs ordered are listed, but only abnormal results are displayed) Labs Reviewed - No data to display  EKG   Radiology No results found.  Procedures Procedures (including critical care time)  Medications Ordered in UC Medications - No data to display  Initial Impression / Assessment and Plan / UC Course  I have reviewed the triage vital signs and the nursing notes.  Pertinent labs & imaging results that were available during my care of the patient were reviewed by me and considered in my medical decision making (see chart for details).   1.  Physically well but worried Patient has had close contact with person with known hepatitis A viral infection.  Vital signs stable in clinic.  Advised patient to follow-up with her primary care provider for hepatitis a screening and detection of hepatitis A antibodies for further workup and management of concern.  No indication for blood work today as possible body fluid exposure via tooth brush happened last night. Patient given print out of  information from up to date regarding patient education for hepatitis A. Discussed hand hygiene etc.   Discussed red flag signs and symptoms of worsening condition,when to call the PCP office, return to urgent care, and when to seek higher level of care in the emergency department. Counseled patient regarding appropriate use of medications and potential side effects for all medications recommended or prescribed today. Patient verbalizes understanding and agreement with plan. Discharged in stable condition.     Final Clinical Impressions(s) / UC Diagnoses   Final diagnoses:  Physically well but worried  At increased risk for exposure to hepatitis A virus     Discharge Instructions      Please schedule an appointment with your primary care provider to discuss further precautions regarding recent possible exposure to hepatitis A virus.  If you develop abdominal pain, fever, chills, abnormal bleeding, or any new symptoms, please return for evaluation.      ED Prescriptions   None    PDMP not reviewed this encounter.   Carlisle Beers, [place] 09/29/22 1232

## 2022-11-02 ENCOUNTER — Other Ambulatory Visit: Payer: Self-pay | Admitting: Obstetrics and Gynecology

## 2022-11-02 DIAGNOSIS — Z1231 Encounter for screening mammogram for malignant neoplasm of breast: Secondary | ICD-10-CM

## 2022-12-20 ENCOUNTER — Ambulatory Visit
Admission: RE | Admit: 2022-12-20 | Discharge: 2022-12-20 | Disposition: A | Discharge status: Home / Self Care | Payer: 59 | Source: Ambulatory Visit | Attending: Obstetrics and Gynecology | Admitting: Obstetrics and Gynecology

## 2022-12-20 DIAGNOSIS — Z1231 Encounter for screening mammogram for malignant neoplasm of breast: Secondary | ICD-10-CM

## 2022-12-26 ENCOUNTER — Encounter: Payer: Self-pay | Admitting: Neurology

## 2022-12-26 ENCOUNTER — Ambulatory Visit (INDEPENDENT_AMBULATORY_CARE_PROVIDER_SITE_OTHER): Payer: 59 | Admitting: Neurology

## 2022-12-26 VITALS — BP 103/72 | HR 86 | Ht 63.5 in | Wt 253.8 lb

## 2022-12-26 DIAGNOSIS — G40009 Localization-related (focal) (partial) idiopathic epilepsy and epileptic syndromes with seizures of localized onset, not intractable, without status epilepticus: Secondary | ICD-10-CM

## 2022-12-26 MED ORDER — LEVETIRACETAM 500 MG PO TABS: ORAL_TABLET | ORAL | 3 refills | Status: DC

## 2022-12-26 MED ORDER — LACOSAMIDE 150 MG PO TABS: 1.0000 | ORAL_TABLET | Freq: Two times a day (BID) | ORAL | 3 refills | Status: DC

## 2022-12-26 NOTE — Progress Notes (Signed)
NEUROLOGY FOLLOW UP OFFICE NOTE  AHNA KONKLE [id number] [birth year]  HISTORY OF PRESENT ILLNESS: I had the pleasure of seeing Jeffrey Voth in follow-up in the neurology clinic on 12/26/2022.  The patient was last seen 7 months ago for seizures. She is alone in the office today.  Records and images were personally reviewed where available.  Since her last visit, she has been told by a friend she talks to daily that she would space out/stop talking briefly. She thinks it is because she is distracted watching a movie while he is talking. She has an aide that comes daily who may have mentioned that she is spaced out once or twice, but not frequent. She sometimes notices some gaps in time but they are rare. She has woken up once or twice with slight tongue bite, no incontinence. She recalls an instance a long time ago when she decided to stop all her medications for a week to see how she does without, and started smelling a funny type of smell. Her legs also started swelling, so she restarted all medications and has been taking them regularly since then and denies any further olfactory/gustatory hallucinations. No focal numbness/tingling/weakness, myoclonic jerks, headaches, dizziness, no falls. Sleep is overall okay. She is on Lacosamide 150mg  BID and Levetiracetam 750mg  BID (500mg  1.5 tabs BID) without side effects. She takes Gabapentin 300mg  daily for neuropathy. She is driving.   History on Initial Assessment 05/26/2019: This is a 57 year old right-handed woman with a history of hypertension, diabetes, fibromyalgia, stroke in 1997, presenting for evaluation of seizures. 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 She has been seeing neurologist Dr. Estella Husk, and when she first started seeing him, her mother reported staring/unresponsive  episodes. She reports "there was something showing up on the brain wave test," and she was started on Vimpat and Keppra at the same time in 2013 or 2014. She is not sure if the episodes quieted down because she is mostly alone at home. She has a CNA coming daily  who tells her she is not responding, last episode was a few days ago. The CNA tells here mostly everyday that something happened. She has occasional episodes of a metallic taste in her mouth. Sometimes she comes to with her teeth down on her tongue, unsure how it happened. No incontinence. She has occasional twitching in her eyes, either leg or arm. One time in church last month, both hands started trembling and she could not stop them. There was a prickly sensation at the tip of her fingers that lasted more than 15 seconds. She could understand things around her but her body felt weird. There was slight dizziness and a hot/cold sensation. She lives with her sister and would be told that she does not answer/hear them. She has gaps in time "all the time." When driving, she does not remember passing certain places. When she laughs too hard, she can get lightheaded and things go black, then she wakes up on the floor. This happened a couple of weeks ago while she was sitting. She thinks she has seizures in her sleep, she wakes up in the morning with her teeth on her tongue. She ran out of Keppra and Vimpat 3 months ago and feels she is having more of the night events.  She has  had headaches for several years with pain on the vertex and right side occurring around twice a week. No associated nausea/vomiting. There are some flickering lights. Tylenol helps. She has intermittent blurred vision that her eye doctor thinks is related to fibromyalgia. She was diagnosed with fibromyalgia in 2006, she has very sensitive skin even to light touch, in constant pain 24/7. When really bad, she needs help eating because she can barely lift her arms up. Her left arm has been  in constant severe daily pain the past month, which has affected her sleep. She has been told she has carpal tunnel syndrome on the left hand 5 years ago. She was getting injections in her neck and back. She takes gabapentin once a day, and is not aware she was told to take it BID. She denies being under a lot of stress.   Epilepsy Risk Factors:  She had a normal birth and early development.  There is no history of febrile convulsions, CNS infections such as meningitis/encephalitis, significant traumatic brain injury, neurosurgical procedures, or family history of seizures.  Diagnostic Data:  MRI brain with and without contrast done 06/2019 which was normal, hippocampi symmetric with no abnormal signal or enhancement seen. Her routine and 48-hour EEG in 05/2019 was normal, however typical events were not captured.    PAST MEDICAL HISTORY: Past Medical History:  Diagnosis Date   Acid reflux    Anemia    Anxiety    Chronic headaches    Degenerative disc disease    lumbar   Diabetes mellitus without complication (HCC)    Fibromyalgia    Heart murmur    Hypertension    Scoliosis    Seizures (HCC)    Stroke (HCC) 1997    MEDICATIONS: Current Outpatient Medications on File Prior to Visit  Medication Sig Dispense Refill   ACCU-CHEK AVIVA PLUS test strip      acetaminophen (TYLENOL) 325 MG tablet Take 2 tablets (650 mg total) by mouth every 6 (six) hours as needed for mild pain, moderate pain or headache. 60 tablet 0   Alcohol Swabs (ALCOHOL PREP) 70 % PADS      ALLERGY RELIEF 10 MG tablet Take 10 mg by mouth daily.     ASSURE COMFORT LANCETS 30G MISC      atorvastatin (LIPITOR) 20 MG tablet Take 20 mg by mouth daily.     Blood Glucose Calibration (ACCU-CHEK AVIVA) SOLN      Blood Glucose Monitoring Suppl (ACCU-CHEK AVIVA PLUS) W/DEVICE KIT      gabapentin (NEURONTIN) 300 MG capsule TAKE 1 CAPSULE BY MOUTH 2 TIMES DAILY (Patient taking differently: daily.) 60 capsule 3   HUMALOG MIX  75/25 KWIKPEN (75-25) 100 UNIT/ML KwikPen Inject into the skin. 20 units  in the am and 10 units at night     Lacosamide 150 MG TABS Take 1 tablet (150 mg total) by mouth 2 (two) times daily. 180 tablet 3   Lancet Devices (ADJUSTABLE LANCING DEVICE) MISC      levETIRAcetam (KEPPRA) 500 MG tablet Take 1 and 1/2 tablets twice a day 270 tablet 3   olmesartan-hydrochlorothiazide (BENICAR HCT) 20-12.5 MG tablet Take 1 tablet by mouth daily. 90 tablet 1   OZEMPIC, 0.25 OR 0.5 MG/DOSE, 2 MG/3ML SOPN SMARTSIG:0.25 Milligram(s) SUB-Q Once a Week     Travoprost, BAK Free, (TRAVATAN) 0.004 % SOLN ophthalmic solution 1 drop at bedtime.     aspirin EC 81 MG tablet Take 1 tablet (81 mg total) by mouth daily. (  Patient not taking: Reported on 12/26/2022) 30 tablet 0   No current facility-administered medications on file prior to visit.    ALLERGIES: Allergies  Allergen Reactions   Pregabalin Other (See Comments)    Zoned out, talking crazy   Topiramate Other (See Comments)    REACTION: caused "inside seizures"   Carbamazepine    Metronidazole Rash   Milnacipran Palpitations    FAMILY HISTORY: Family History  Problem Relation Age of Onset   Diabetes Mother    Hyperlipidemia Mother    Heart attack Father    Diabetes Sister    Breast cancer Sister    Diabetes Brother    Breast cancer Paternal Grandmother    Colon cancer Neg Hx     SOCIAL HISTORY: Social History   Socioeconomic History   Marital status: Divorced    Spouse name: Not on file   Number of children: Not on file   Years of education: Not on file   Highest education level: Not on file  Occupational History   Not on file  Tobacco Use   Smoking status: Never   Smokeless tobacco: Never  Vaping Use   Vaping status: Never Used  Substance and Sexual Activity   Alcohol use: No   Drug use: No   Sexual activity: Not on file  Other Topics Concern   Not on file  Social History Narrative   Right handed    One story home few  steps   Lives with sister    Social Determinants of Health   Financial Resource Strain: At Risk (09/19/2022)   Received from [place], [place]   [employer]    Financial Resource Strain: 2  Food Insecurity: Not at Risk (09/19/2022)   Received from [place], [place]   Food Insecurity    Food: 1  Transportation Needs: Not at Risk (09/19/2022)   Received from [place], [employer] Needs    Transportation: 1  Physical Activity: Not at Risk (09/19/2022)   Received from [place], [place]   Physical Activity    Physical Activity: 1  Stress: Not at Risk (09/19/2022)   Received from [place], [place]   Stress    Stress: 1  Social Connections: At Risk (09/19/2022)   Received from [facility]   Social Connections    Connectedness: 2  Intimate Partner Violence: Not on file     PHYSICAL EXAM: Vitals:   12/26/22 0826  BP: 103/72  Pulse: 86  SpO2: 98%   General: No acute distress Head:  Normocephalic/atraumatic Skin/Extremities: No rash, no edema Neurological Exam: alert and awake. No aphasia or dysarthria. Fund of knowledge is appropriate. Attention and concentration are normal.   Cranial nerves: Pupils equal, round. Extraocular movements intact with no nystagmus. Visual fields full.  No facial asymmetry.  Motor: Bulk and tone normal, muscle strength 5/5 throughout with no pronator drift.   Finger to nose testing intact.  Gait narrow-based and steady, able to tandem walk adequately.  Romberg negative.   IMPRESSION: This is a 57 yo RH woman with a history of hypertension, diabetes, fibromyalgia, stroke in 1997, and seizures. She describes a metallic taste, staring/unresponsive episodes, gaps in time, as well as waking up with near-tongue bites. She has been told her EEG in the past was abnormal, her 48-hour EEG in 2021 and MRI brain were normal. Etiology of seizures unclear. She reports being told she would briefly stop talking when on the phone, but she thinks it is due to being distracted. We  discussed  option to increase Levetiracetam to 1000mg  BID, however she would like to stay on current regimen, refills sent for Levetiracetam 500mg  1.5 tabs BID (750mg  BID) and Lacosamide 150mg  BID. Continue to monitor symptoms. She is aware of [place] driving laws to stop driving after an episode of loss of awareness until 6 months seizure-free. Follow-up in 6 months, call for any changes.   Thank you for allowing me to participate in her care.  Please do not hesitate to call for any questions or concerns.    Patrcia Dolly, M.D.   CC: Dr. Roxan Hockey

## 2022-12-26 NOTE — Patient Instructions (Addendum)
Always good to see you.   Continue Lacosamide 150mg  twice a day  2. Continue Levetiracetam 500mg : take 1 and 1/2 tablets twice a day  3. Continue to monitor the spacing out, there is room to increase medication if needed  4. Follow-up in 6 months, call for any changes   Seizure Precautions: 1. If medication has been prescribed for you to prevent seizures, take it exactly as directed.  Do not stop taking the medicine without talking to your doctor first, even if you have not had a seizure in a long time.   2. Avoid activities in which a seizure would cause danger to yourself or to others.  Don't operate dangerous machinery, swim alone, or climb in high or dangerous places, such as on ladders, roofs, or girders.  Do not drive unless your doctor says you may.  3. If you have any warning that you may have a seizure, lay down in a safe place where you can't hurt yourself.    4.  No driving for 6 months from last seizure, as per [facility].   Please refer to the following link on the Epilepsy Foundation of America's website for more information: http://www.epilepsyfoundation.org/answerplace/Social/driving/drivingu.cfm   5.  Maintain good sleep hygiene. Avoid alcohol  6.  Contact your doctor if you have any problems that may be related to the medicine you are taking.  7.  Call 911 and bring the patient back to the ED if:        A.  The seizure lasts longer than 5 minutes.       B.  The patient doesn't awaken shortly after the seizure  C.  The patient has new problems such as difficulty seeing, speaking or moving  D.  The patient was injured during the seizure  E.  The patient has a temperature over 102 F (39C)  F.  The patient vomited and now is having trouble breathing

## 2023-07-01 ENCOUNTER — Other Ambulatory Visit: Payer: Self-pay | Admitting: Neurology

## 2023-07-23 ENCOUNTER — Other Ambulatory Visit (HOSPITAL_COMMUNITY): Payer: Self-pay

## 2023-07-23 ENCOUNTER — Telehealth (INDEPENDENT_AMBULATORY_CARE_PROVIDER_SITE_OTHER): Payer: 59 | Admitting: Neurology

## 2023-07-23 ENCOUNTER — Encounter: Payer: Self-pay | Admitting: Neurology

## 2023-07-23 ENCOUNTER — Telehealth: Payer: Self-pay

## 2023-07-23 VITALS — Ht 63.0 in | Wt 251.0 lb

## 2023-07-23 DIAGNOSIS — G40009 Localization-related (focal) (partial) idiopathic epilepsy and epileptic syndromes with seizures of localized onset, not intractable, without status epilepticus: Secondary | ICD-10-CM | POA: Diagnosis not present

## 2023-07-23 MED ORDER — LACOSAMIDE 150 MG PO TABS: 1.0000 | ORAL_TABLET | Freq: Two times a day (BID) | ORAL | 3 refills | Status: DC

## 2023-07-23 MED ORDER — LEVETIRACETAM 500 MG PO TABS: ORAL_TABLET | ORAL | 3 refills | Status: DC

## 2023-07-23 NOTE — Telephone Encounter (Signed)
 Pharmacy Patient Advocate Encounter   Received notification from CoverMyMeds that prior authorization for Lacosamide  150MG  tablets is required/requested.   Insurance verification completed.   The patient is insured through [facility] .   Per test claim: Refill too soon. PA is not needed at this time. Medication was filled 07/01/2023. Next eligible fill date is 07/24/2023.

## 2023-07-23 NOTE — Patient Instructions (Signed)
 Good to see you doing well. We can continue on Lacosamide  150mg  daily and Levetiracetam  500mg  daily, but please let me know if any change in symptoms. Follow-up in 6-8 months, call for any changes.    Seizure Precautions: 1. If medication has been prescribed for you to prevent seizures, take it exactly as directed.  Do not stop taking the medicine without talking to your doctor first, even if you have not had a seizure in a long time.   2. Avoid activities in which a seizure would cause danger to yourself or to others.  Don't operate dangerous machinery, swim alone, or climb in high or dangerous places, such as on ladders, roofs, or girders.  Do not drive unless your doctor says you may.  3. If you have any warning that you may have a seizure, lay down in a safe place where you can't hurt yourself.    4.  No driving for 6 months from last seizure, as per [place]  state law.   Please refer to the following link on the Epilepsy Foundation of America's website for more information: http://www.epilepsyfoundation.org/answerplace/Social/driving/drivingu.cfm   5.  Maintain good sleep hygiene. Avoid alcohol.  6.  Contact your doctor if you have any problems that may be related to the medicine you are taking.  7.  Call 911 and bring the patient back to the ED if:        A.  The seizure lasts longer than 5 minutes.       B.  The patient doesn't awaken shortly after the seizure  C.  The patient has new problems such as difficulty seeing, speaking or moving  D.  The patient was injured during the seizure  E.  The patient has a temperature over 102 F (39C)  F.  The patient vomited and now is having trouble breathing

## 2023-07-23 NOTE — Progress Notes (Signed)
 Virtual Visit via Video Note The purpose of this virtual visit is to provide medical care while limiting exposure to the novel coronavirus.    Consent was obtained for video visit:  Yes.   Answered questions that patient had about telehealth interaction:  Yes.   I discussed the limitations, risks, security and privacy concerns of performing an evaluation and management service by telemedicine. I also discussed with the patient that there may be a patient responsible charge related to this service. The patient expressed understanding and agreed to proceed.  Pt location: Home Physician Location: office Name of referring provider:  Vergil Glasser, MD I connected with Helen [name] at patients initiation/request on 07/23/2023 at  8:30 AM EDT by video enabled telemedicine application and verified that I am speaking with the correct person using two identifiers. Pt MRN:  [id number] Pt DOB:  04-Sep-1965 Video Participants:  Helen [name]   History of Present Illness:  The patient had a virtual video visit on 07/23/2023. She was last seen in the neurology clinic 6 months ago for seizures. Since her last visit, she reports doing very well. She was previously reporting spacing out/stop talking briefly, which she attributed to being distracted. She has an aide who comes daily and has not reported any staring/unresponsive episodes. She denies any gaps in time, olfactory/gustatory hallucinations, focal numbness/tingling/weakness, myoclonic jerks. No headaches, dizziness, vision changes, no falls. Sleep and mood are good. She reports self-reducing her medications to once a day, taking Lacosamide  150mg  daily (instead of BID), and Levetiracetam  500mg  daily (instead of 1.5 tabs BID). No side effects. She is on Gabapentin "for the nerves" and states she only takes it as needed and has not been having much problems, unable to recall when she last took it. She also stopped the daily aspirin . She is driving.     History on Initial Assessment 05/26/2019: This is a 58 year old right-handed woman with a history of hypertension, diabetes, fibromyalgia, stroke in 1997, presenting for evaluation of seizures. 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 She has been seeing neurologist Dr. Maxie Spaniel, and when she first started seeing him, her mother reported staring/unresponsive episodes. She reports "there was something showing up on the brain wave test," and she was started on Vimpat  and Keppra  at the same time in 2013 or 2014. She is not sure if the episodes quieted down because she is mostly alone at home. She has a CNA coming daily  who tells her she is not responding, last episode was a few days ago. The CNA tells here mostly everyday that something happened. She has occasional episodes of a metallic taste in her mouth. Sometimes she comes to with her teeth down on her tongue, unsure how it happened. No incontinence. She has occasional twitching in her eyes, either leg or arm. One time in church last month, both hands started trembling and she could not stop them. There was a prickly sensation at the tip of her fingers that lasted more than 15 seconds. She could understand things around her but her body felt weird. There was slight dizziness and a hot/cold sensation. She lives with her sister and would be told that she does not answer/hear them. She has gaps in time "all the time." When driving, she does not remember passing certain places.  When she laughs too hard, she can get lightheaded and things go black, then she wakes up on the floor. This happened a couple of weeks ago while she was sitting. She thinks she has seizures in her sleep, she wakes up in the morning with her teeth on her tongue. She ran out of Keppra  and Vimpat  3 months ago and feels  she is having more of the night events.  She has had headaches for several years with pain on the vertex and right side occurring around twice a week. No associated nausea/vomiting. There are some flickering lights. Tylenol  helps. She has intermittent blurred vision that her eye doctor thinks is related to fibromyalgia. She was diagnosed with fibromyalgia in 2006, she has very sensitive skin even to light touch, in constant pain 24/7. When really bad, she needs help eating because she can barely lift her arms up. Her left arm has been in constant severe daily pain the past month, which has affected her sleep. She has been told she has carpal tunnel syndrome on the left hand 5 years ago. She was getting injections in her neck and back. She takes gabapentin once a day, and is not aware she was told to take it BID. She denies being under a lot of stress.   Epilepsy Risk Factors:  She had a normal birth and early development.  There is no history of febrile convulsions, CNS infections such as meningitis/encephalitis, significant traumatic brain injury, neurosurgical procedures, or family history of seizures.  Diagnostic Data:  MRI brain with and without contrast done 06/2019 which was normal, hippocampi symmetric with no abnormal signal or enhancement seen. Her routine and 48-hour EEG in 05/2019 was normal, however typical events were not captured.     Current Outpatient Medications on File Prior to Visit  Medication Sig Dispense Refill   ACCU-CHEK AVIVA PLUS test strip      acetaminophen  (TYLENOL ) 325 MG tablet Take 2 tablets (650 mg total) by mouth every 6 (six) hours as needed for mild pain, moderate pain or headache. 60 tablet 0   Alcohol Swabs (ALCOHOL PREP) 70 % PADS      ASSURE COMFORT LANCETS 30G MISC      atorvastatin (LIPITOR) 20 MG tablet Take 20 mg by mouth daily.     Blood Glucose Calibration (ACCU-CHEK AVIVA) SOLN      gabapentin (NEURONTIN) 300 MG capsule TAKE 1 CAPSULE BY MOUTH 2 TIMES  DAILY (Patient taking differently: daily.) 60 capsule 3   HUMALOG MIX 75/25 KWIKPEN (75-25) 100 UNIT/ML KwikPen Inject into the skin. 20 units  in the am and 10 units at night     Lacosamide  150 MG TABS Take 1 tablet (150 mg total) by mouth 2 (two) times daily. 60 tablet 5   Lancet Devices (ADJUSTABLE LANCING DEVICE) MISC      levETIRAcetam  (KEPPRA ) 500 MG tablet Take 1 and 1/2 tablets twice a day 270 tablet 3   olmesartan -hydrochlorothiazide (BENICAR  HCT) 20-12.5 MG tablet Take 1 tablet by mouth daily. 90 tablet 1   OZEMPIC, 0.25 OR 0.5 MG/DOSE, 2 MG/3ML SOPN SMARTSIG:0.25 Milligram(s) SUB-Q Once a Week     Travoprost, BAK Free, (TRAVATAN) 0.004 % SOLN ophthalmic solution 1 drop at bedtime.     ALLERGY RELIEF 10 MG tablet Take 10 mg by mouth daily. (Patient not taking: Reported on 07/23/2023)     aspirin  EC 81 MG tablet Take 1 tablet (81 mg total) by mouth daily. (Patient not taking: Reported on 12/26/2022) 30 tablet  0   Blood Glucose Monitoring Suppl (ACCU-CHEK AVIVA PLUS) W/DEVICE KIT  (Patient not taking: Reported on 07/23/2023)     No current facility-administered medications on file prior to visit.     Observations/Objective:   Vitals:   07/23/23 0824  Weight: 251 lb (113.9 kg)  Height: 5\' 3"  (1.6 m)   GEN:  The patient appears stated age and is in NAD.  Neurological examination: Patient is awake, alert. No aphasia or dysarthria. Intact fluency and comprehension. Cranial nerves: Extraocular movements intact with no nystagmus. No facial asymmetry. Motor: moves all extremities symmetrically, at least anti-gravity x 4.    Assessment and Plan:   This is a 58 yo RH woman with a history of hypertension, diabetes, fibromyalgia, stroke in 1997, and seizures. She had described a metallic taste, staring/unresponsive episodes, gaps in time, as well as waking up with near-tongue bites. She has been told her EEG in the past was abnormal, her 48-hour EEG in 2021 and MRI brain were normal. Etiology  of seizures unclear. She reports doing well and self-reducing her medications to Lacosamide  150mg  daily and Levetiracetam  500mg  daily. We agreed to stay on these doses, she knows to call for any change in symptoms. [place] driving laws were discussed, she knows to stop driving after a seizure until 6 months seizure-free. She also reports stopping aspirin , we discussed prior history of stroke and recommendation to restart aspirin  for secondary stroke prevention, continue control of vascular risk factors. Follow-up in 6-8 months, call for any changes.    Follow Up Instructions:    -I discussed the assessment and treatment plan with the patient. The patient was provided an opportunity to ask questions and all were answered. The patient agreed with the plan and demonstrated an understanding of the instructions.   The patient was advised to call back or seek an in-person evaluation if the symptoms worsen or if the condition fails to improve as anticipated.   Jhonny Moss, MD

## 2023-07-23 NOTE — Addendum Note (Signed)
 Addended by: Jhonny Moss on: 07/23/2023 09:07 AM   Modules accepted: Level of Service

## 2023-10-22 ENCOUNTER — Other Ambulatory Visit: Payer: Self-pay | Admitting: Family

## 2023-10-22 ENCOUNTER — Other Ambulatory Visit: Payer: Self-pay | Admitting: Neurology

## 2023-10-22 DIAGNOSIS — Z1231 Encounter for screening mammogram for malignant neoplasm of breast: Secondary | ICD-10-CM

## 2023-12-08 ENCOUNTER — Emergency Department (HOSPITAL_COMMUNITY)

## 2023-12-08 ENCOUNTER — Emergency Department (HOSPITAL_BASED_OUTPATIENT_CLINIC_OR_DEPARTMENT_OTHER)
Admission: EM | Admit: 2023-12-08 | Discharge: 2023-12-09 | Disposition: A | Discharge status: Home / Self Care | Attending: Emergency Medicine | Admitting: Emergency Medicine

## 2023-12-08 ENCOUNTER — Other Ambulatory Visit: Payer: Self-pay

## 2023-12-08 ENCOUNTER — Encounter (HOSPITAL_BASED_OUTPATIENT_CLINIC_OR_DEPARTMENT_OTHER): Payer: Self-pay

## 2023-12-08 ENCOUNTER — Emergency Department (HOSPITAL_BASED_OUTPATIENT_CLINIC_OR_DEPARTMENT_OTHER)

## 2023-12-08 DIAGNOSIS — Z7982 Long term (current) use of aspirin: Secondary | ICD-10-CM | POA: Insufficient documentation

## 2023-12-08 DIAGNOSIS — Z794 Long term (current) use of insulin: Secondary | ICD-10-CM | POA: Diagnosis not present

## 2023-12-08 DIAGNOSIS — R209 Unspecified disturbances of skin sensation: Secondary | ICD-10-CM | POA: Diagnosis present

## 2023-12-08 DIAGNOSIS — Z79899 Other long term (current) drug therapy: Secondary | ICD-10-CM | POA: Insufficient documentation

## 2023-12-08 DIAGNOSIS — R200 Anesthesia of skin: Secondary | ICD-10-CM

## 2023-12-08 DIAGNOSIS — R519 Headache, unspecified: Secondary | ICD-10-CM | POA: Insufficient documentation

## 2023-12-08 DIAGNOSIS — Z8673 Personal history of transient ischemic attack (TIA), and cerebral infarction without residual deficits: Secondary | ICD-10-CM | POA: Diagnosis not present

## 2023-12-08 DIAGNOSIS — M79605 Pain in left leg: Secondary | ICD-10-CM | POA: Insufficient documentation

## 2023-12-08 LAB — CBC WITH DIFFERENTIAL/PLATELET
Abs Immature Granulocytes: 0.04 K/uL (ref 0.00–0.07)
Basophils Absolute: 0 K/uL (ref 0.0–0.1)
Basophils Relative: 0 %
Eosinophils Absolute: 0.1 K/uL (ref 0.0–0.5)
Eosinophils Relative: 1 %
HCT: 35.9 % — ABNORMAL LOW (ref 36.0–46.0)
Hemoglobin: 11.3 g/dL — ABNORMAL LOW (ref 12.0–15.0)
Immature Granulocytes: 0 %
Lymphocytes Relative: 35 %
Lymphs Abs: 4.6 K/uL — ABNORMAL HIGH (ref 0.7–4.0)
MCH: 26.9 pg (ref 26.0–34.0)
MCHC: 31.5 g/dL (ref 30.0–36.0)
MCV: 85.5 fL (ref 80.0–100.0)
Monocytes Absolute: 0.7 K/uL (ref 0.1–1.0)
Monocytes Relative: 5 %
Neutro Abs: 7.6 K/uL (ref 1.7–7.7)
Neutrophils Relative %: 59 %
Platelets: 286 K/uL (ref 150–400)
RBC: 4.2 MIL/uL (ref 3.87–5.11)
RDW: 15.6 % — ABNORMAL HIGH (ref 11.5–15.5)
WBC: 13.1 K/uL — ABNORMAL HIGH (ref 4.0–10.5)
nRBC: 0 % (ref 0.0–0.2)

## 2023-12-08 LAB — COMPREHENSIVE METABOLIC PANEL WITH GFR
ALT: 12 U/L (ref 0–44)
AST: 16 U/L (ref 15–41)
Albumin: 4 g/dL (ref 3.5–5.0)
Alkaline Phosphatase: 92 U/L (ref 38–126)
Anion gap: 12 (ref 5–15)
BUN: 15 mg/dL (ref 6–20)
CO2: 24 mmol/L (ref 22–32)
Calcium: 9.8 mg/dL (ref 8.9–10.3)
Chloride: 105 mmol/L (ref 98–111)
Creatinine, Ser: 0.82 mg/dL (ref 0.44–1.00)
GFR, Estimated: >60 mL/min (ref >60)
Glucose, Bld: 111 mg/dL — ABNORMAL HIGH (ref 70–99)
Potassium: 3.8 mmol/L (ref 3.5–5.1)
Sodium: 141 mmol/L (ref 135–145)
Total Bilirubin: 0.3 mg/dL (ref 0.0–1.2)
Total Protein: 7.3 g/dL (ref 6.5–8.1)

## 2023-12-08 NOTE — ED Triage Notes (Addendum)
 Pt c/o numbness, swelling & heaviness in L arm, swelling all the way down to ankle on L leg, burning sensation to L hand onset approx 3hrs ago. Denies HA, blurred vision, gait disturbance.   Hx stroke, states numbness in arm is similar, but that started in my head & this starts in my arm.

## 2023-12-08 NOTE — ED Provider Notes (Signed)
 [place] EMERGENCY DEPARTMENT AT [facility] Provider Note   CSN: [id number] Arrival date & time: 12/08/23  1945     Patient presents with: Numbness and Leg Pain   Helen [name] is a 58 y.o. female.   Patient here with numbness to the left side of her body. Left side of her face arm and leg. Started some point this afternoon. She doesn't really have a last known normal. She can't remember exactly when it happened. It seems at least five hours ago upon my evaluation. She states a history of stroke history of headaches and migraines. History of fibromyalgia. She felt a fullness in her left arm and left leg and a burning sensation in her hand. She has a mild headache now. She denies any speech changes vision loss difficulty walking. She has some tightness in her left hamstring she states. She denies any chest pain shortness of breath. History of diabetes.  The history is provided by the patient.       Prior to Admission medications   Medication Sig Start Date End Date Taking? Authorizing Provider  ACCU-CHEK AVIVA PLUS test strip  06/29/14   [provider]  acetaminophen  (TYLENOL ) 325 MG tablet Take 2 tablets (650 mg total) by mouth every 6 (six) hours as needed for mild pain, moderate pain or headache. 04/02/17   Dansie, William, PA-C  Alcohol Swabs (ALCOHOL PREP) 70 % PADS  06/29/14   [provider]  aspirin  EC 81 MG tablet Take 1 tablet (81 mg total) by mouth daily. Patient not taking: Reported on 12/26/2022 08/05/14   Madelyne Owen LABOR, MD  ASSURE COMFORT LANCETS 30G MISC  06/29/14   [provider]  atorvastatin (LIPITOR) 20 MG tablet Take 20 mg by mouth daily. 04/02/19   [provider]  Blood Glucose Calibration (ACCU-CHEK AVIVA) SOLN  06/29/14   [provider]  Blood Glucose Monitoring Suppl (ACCU-CHEK AVIVA PLUS) W/DEVICE KIT  06/29/14   [provider]  gabapentin (NEURONTIN) 300 MG capsule TAKE 1 CAPSULE BY MOUTH 2 TIMES  DAILY Patient taking differently: daily. 08/17/20   Vernetta Lonni GRADE, MD  HUMALOG MIX 75/25 KWIKPEN (75-25) 100 UNIT/ML KwikPen Inject into the skin. 20 units  in the am and 10 units at night 05/25/21   [provider]  Lacosamide  150 MG TABS Take 1 tablet (150 mg total) by mouth 2 (two) times daily. Take 1 tablet every morning 07/23/23   Georjean Helen HERO, MD  Lancet Devices (ADJUSTABLE LANCING DEVICE) MISC  06/29/14   [provider]  levETIRAcetam  (KEPPRA ) 500 MG tablet Take 1 and 1/2 tablets twice a day 10/22/23   Georjean Helen HERO, MD  olmesartan -hydrochlorothiazide (BENICAR  HCT) 20-12.5 MG tablet Take 1 tablet by mouth daily. 08/27/18   Ladona Heinz, MD  OZEMPIC, 0.25 OR 0.5 MG/DOSE, 2 MG/3ML SOPN SMARTSIG:0.25 Milligram(s) SUB-Q Once a Week    [provider]  Travoprost, BAK Free, (TRAVATAN) 0.004 % SOLN ophthalmic solution 1 drop at bedtime. 05/11/19   [provider]    Allergies: Pregabalin, Topiramate, Carbamazepine, Metronidazole, and Milnacipran    Review of Systems  Updated Vital Signs BP (!) 143/97   Pulse 77   Temp (!) 97.5 F (36.4 C) (Oral)   Resp 18   SpO2 100%   Physical Exam Vitals and nursing note reviewed.  Constitutional:      General: She is not in acute distress.    Appearance: She is well-developed. She is not ill-appearing.  HENT:  Head: Normocephalic and atraumatic.     Nose: Nose normal.     Mouth/Throat:     Mouth: Mucous membranes are moist.  Eyes:     Extraocular Movements: Extraocular movements intact.     Conjunctiva/sclera: Conjunctivae normal.     Pupils: Pupils are equal, round, and reactive to light.  Cardiovascular:     Rate and Rhythm: Normal rate and regular rhythm.     Pulses: Normal pulses.     Heart sounds: Normal heart sounds. No murmur heard. Pulmonary:     Effort: Pulmonary effort is normal. No respiratory distress.     Breath sounds: Normal breath sounds.  Abdominal:     Palpations: Abdomen is  soft.     Tenderness: There is no abdominal tenderness.  Musculoskeletal:        General: No swelling.     Cervical back: Normal range of motion and neck supple.  Skin:    General: Skin is warm and dry.     Capillary Refill: Capillary refill takes less than 2 seconds.  Neurological:     General: No focal deficit present.     Mental Status: She is alert and oriented to person, place, and time.     Cranial Nerves: No cranial nerve deficit.     Motor: No weakness.     Coordination: Coordination normal.     Comments: Numbness of the left out of the face arm and leg but she has normal strength throughout, normal visual feels normal cranial nerves normal speech normal coordination  Psychiatric:        Mood and Affect: Mood normal.     (all labs ordered are listed, but only abnormal results are displayed) Labs Reviewed  COMPREHENSIVE METABOLIC PANEL WITH GFR - Abnormal; Notable for the following components:      Result Value   Glucose, Bld 111 (*)    All other components within normal limits  CBC WITH DIFFERENTIAL/PLATELET - Abnormal; Notable for the following components:   WBC 13.1 (*)    Hemoglobin 11.3 (*)    HCT 35.9 (*)    RDW 15.6 (*)    Lymphs Abs 4.6 (*)    All other components within normal limits    EKG: EKG Interpretation Date/Time:  Sunday December 08 2023 19:55:40 EDT Ventricular Rate:  75 PR Interval:  164 QRS Duration:  79 QT Interval:  358 QTC Calculation: 400 R Axis:   26  Text Interpretation: Sinus rhythm Confirmed by Ruthe Cornet [phone]) on 12/08/2023 8:34:16 PM  Radiology: CT Head Wo Contrast Result Date: 12/08/2023 CLINICAL DATA:  Increasing headaches EXAM: CT HEAD WITHOUT CONTRAST TECHNIQUE: Contiguous axial images were obtained from the base of the skull through the vertex without intravenous contrast. RADIATION DOSE REDUCTION: This exam was performed according to the departmental dose-optimization program which includes automated exposure control,  adjustment of the mA and/or kV according to patient size and/or use of iterative reconstruction technique. COMPARISON:  12/12/2015 FINDINGS: Brain: No evidence of acute infarction, hemorrhage, hydrocephalus, extra-axial collection or mass lesion/mass effect. Vascular: No hyperdense vessel or unexpected calcification. Skull: Normal. Negative for fracture or focal lesion. Sinuses/Orbits: No acute finding. Other: None. IMPRESSION: No acute intracranial abnormality noted. Electronically Signed   By: Oneil Devonshire M.D.   On: 12/08/2023 20:30     Procedures   Medications Ordered in the ED - No data to display  Medical Decision Making Amount and/or Complexity of Data Reviewed Labs: ordered. Radiology: ordered.   Helen [name] is here with numbness over the left side of her body face arm and leg. Some point this afternoon it started she doesn't know her exact well known. But seems like it's been more than 4 1/2 hours. She has no major weakness on exam. She subjectively numb to the left out of her face arm and leg. I don't see any major processes otherwise. She has a history of fibromyalgia stroke hypertension diabetes. Prior MRI is that I've reviewed don't ever show any obvious stroke on them but she states her stroke was a long time ago. Doesn't have any deficits from them. 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 She had a mild headache maybe when this started but she is feeling better now. She doesn't feel like this is her fibromyalgia. She's not have any chest pain shortness of breath. EKG showed sinus rhythm. No ischemic changes. I don't appreciate any swelling on exam. She had a head CT that was unremarkable. Basic labs were unremarkable. Have no concern for large vessel occlusion but I do think it might be worth getting the MRI to make sure there wasn't a small muscle stroke. Show go POV to [facility] for MRI. Dr. Rogelia aware. She stable for transfer. I anticipate that  she could be discharge if MRI is unremarkable. Have low suspicion further key process at this time.  This chart was dictated using voice recognition software.  Despite best efforts to proofread,  errors can occur which can change the documentation meaning.      Final diagnoses:  Numbness    ED Discharge Orders     None          Ruthe Cornet, DO 12/08/23 2143

## 2023-12-09 ENCOUNTER — Emergency Department (HOSPITAL_COMMUNITY)

## 2023-12-09 ENCOUNTER — Telehealth: Payer: Self-pay | Admitting: Neurology

## 2023-12-09 DIAGNOSIS — R209 Unspecified disturbances of skin sensation: Secondary | ICD-10-CM | POA: Diagnosis not present

## 2023-12-09 MED ORDER — IOHEXOL 350 MG/ML SOLN
75.0000 mL | Freq: Once | INTRAVENOUS | Status: AC | PRN
  Administered 2023-12-09: 75 mL via INTRAVENOUS

## 2023-12-09 NOTE — Telephone Encounter (Signed)
 LVM--to call the office back.

## 2023-12-09 NOTE — Telephone Encounter (Signed)
 Pt c/o: seizure Missed medications?  No. Sleep deprived?  Yes.   Alcohol intake?  No. Increased stress? No. Any change in medication color or shape? No. Back to their usual baseline self?  Yes.  . If no, advise go to ER Current medications prescribed by Dr. Georjean: vimpat  --1 tab am, keppra  --1 tab am , sometimes 1 tab at night.  Pt stated having pressure back of the head/eye

## 2023-12-09 NOTE — Telephone Encounter (Signed)
 Pt called in this afternoon and she stated that she was in the emergency room yesterday from 7pm to 5:30am. Pt stated that she did an  MRI and 2 CT scans one with  contrast and one without contrast. Pt stated that she has pressure on her head. Thanks

## 2023-12-09 NOTE — Telephone Encounter (Signed)
 Pt called and LM with AN. Sudden on one side of body/face. Numbness and tingling.  Wants to sch a f/u appt. Pt was seen in ER last night  for numbness to her left side.   Testing showed elevation of WBC's. CT w/contrast states something small but nthg really concerning. She is having a headache that starts on the right side/base of head and radiates to left side. This is new since being discharged from ED. Pt sounds very weak and sick to the triager

## 2023-12-09 NOTE — ED Provider Notes (Signed)
 [facility].  Patient seen with left-sided numbness.  Patient underwent MRI while in the waiting room.  She was brought back to her room after the MRI was performed.  MRI does not show any evidence of a stroke.  Examination here reveals persistent numbness with some drift of the left leg.  Discussed with Dr. Lindzen, on-call for neurology.  Recommend CT angiography to rule out tight stenosis.  If negative, patient can follow-up with neurology as an outpatient.  CT angio has been performed, there is some stenosis of right distal P2 segment, does not explain patient's sensory findings currently.  Will discharge, follow-up with her neurologist, Dr. Georjean.   Haze Lonni PARAS, MD 12/09/23 [phone]

## 2023-12-09 NOTE — Telephone Encounter (Signed)
 Pt is returning a call to [employer]

## 2023-12-10 ENCOUNTER — Encounter: Payer: Self-pay | Admitting: Neurology

## 2023-12-10 ENCOUNTER — Ambulatory Visit (INDEPENDENT_AMBULATORY_CARE_PROVIDER_SITE_OTHER): Admitting: Neurology

## 2023-12-10 VITALS — BP 111/78 | HR 86 | Ht 63.5 in | Wt 261.0 lb

## 2023-12-10 DIAGNOSIS — R202 Paresthesia of skin: Secondary | ICD-10-CM | POA: Diagnosis not present

## 2023-12-10 DIAGNOSIS — G40009 Localization-related (focal) (partial) idiopathic epilepsy and epileptic syndromes with seizures of localized onset, not intractable, without status epilepticus: Secondary | ICD-10-CM

## 2023-12-10 DIAGNOSIS — R200 Anesthesia of skin: Secondary | ICD-10-CM

## 2023-12-10 MED ORDER — LEVETIRACETAM 750 MG PO TABS: 750.0000 mg | ORAL_TABLET | Freq: Two times a day (BID) | ORAL | 3 refills | Status: DC

## 2023-12-10 NOTE — Telephone Encounter (Signed)
 Scheduled for 9/16

## 2023-12-10 NOTE — Progress Notes (Signed)
 NEUROLOGY FOLLOW UP OFFICE NOTE  Helen [name] [id number] 31-Dec-1965  HISTORY OF PRESENT ILLNESS: I had the pleasure of seeing Helen [name] in follow-up in the neurology clinic on 12/10/2023.  The patient was last seen 5 months ago for seizures. She is alone in the office today. Records and images were personally reviewed where available.  She presents for evaluation after ER visit on 9/14 for left-sided numbness. She recalls her left arm started getting heavy and tight. She felt numbness in the left shoulder and burning in her left hand. The lateral upper arm was numb, there was tingling on her left 5th digit and big toe, and burning in her left palm. She was washing her hands and looking in the mirror when she noticed her left arm was swollen. She had pressure on the back of her head on the left. No shoulder pain. She denies any recent head injuries, falls, or heavy lifting. I personally reviewed MRI brain without contrast which did not show any acute changes, normal brain MRI. 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 She has neck and back pain and used to get injections. She restarted Gabapentin yesterday.   As far as she knows, she has not had any seizures. She has an aide who has not mentioned any staring episodes. A couple of times she would be talking on the phone and her friend asks where I go to. This has happened a few times in the past week or so. She is on Lacosamide  150mg  (instead of BID) every morning and Levetiracetam  500mg  BID (instead of 1.5 tabs BID) without side effects.     History on Initial Assessment 05/26/2019: This is a 58 year old right-handed woman with a history of hypertension, diabetes, fibromyalgia, stroke in 1997, presenting for evaluation of seizures. 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 She was having inside seizures, waking up in the  morning and the cover was shaking. She has been seeing neurologist Dr. Euna, and when she first started seeing him, her mother reported staring/unresponsive episodes. She reports there was something showing up on the brain wave test, and she was started on Vimpat  and Keppra  at the same time in 2013 or 2014. She is not sure if the episodes quieted down because she is mostly alone at home. She has a CNA coming daily  who tells her she is not responding, last episode was a few days ago. The CNA tells here mostly everyday that something happened. She has occasional episodes of a metallic taste in her mouth. Sometimes she comes to with her teeth down on her tongue, unsure how it happened. No incontinence. She has occasional twitching in her eyes, either leg or arm. One time in church last month, both hands started trembling and she could not stop them. There was a prickly sensation at the tip of her fingers that lasted more than 15 seconds. She could understand things around her but her body felt weird. There was slight dizziness and a hot/cold sensation. She lives with her sister and would be told that she does not answer/hear them. She has gaps in time all the time. When driving, she does not remember passing certain places. When she laughs too hard, she can get lightheaded and things go black, then she wakes up on the floor. This happened a couple of weeks ago while she was  sitting. She thinks she has seizures in her sleep, she wakes up in the morning with her teeth on her tongue. She ran out of Keppra  and Vimpat  3 months ago and feels she is having more of the night events.  She has had headaches for several years with pain on the vertex and right side occurring around twice a week. No associated nausea/vomiting. There are some flickering lights. Tylenol  helps. She has intermittent blurred vision that her eye doctor thinks is related to fibromyalgia. She was diagnosed with fibromyalgia in 2006, she has very  sensitive skin even to light touch, in constant pain 24/7. When really bad, she needs help eating because she can barely lift her arms up. Her left arm has been in constant severe daily pain the past month, which has affected her sleep. She has been told she has carpal tunnel syndrome on the left hand 5 years ago. She was getting injections in her neck and back. She takes gabapentin once a day, and is not aware she was told to take it BID. She denies being under a lot of stress.   Epilepsy Risk Factors:  She had a normal birth and early development.  There is no history of febrile convulsions, CNS infections such as meningitis/encephalitis, significant traumatic brain injury, neurosurgical procedures, or family history of seizures.  Diagnostic Data:  MRI brain with and without contrast done 06/2019 which was normal, hippocampi symmetric with no abnormal signal or enhancement seen. Her routine and 48-hour EEG in 05/2019 was normal, however typical events were not captured.    PAST MEDICAL HISTORY: Past Medical History:  Diagnosis Date   Acid reflux    Anemia    Anxiety    Chronic headaches    Degenerative disc disease    lumbar   Diabetes mellitus without complication (HCC)    Fibromyalgia    Heart murmur    Hypertension    Scoliosis    Seizures (HCC)    Stroke (HCC) 1997    MEDICATIONS: Current Outpatient Medications on File Prior to Visit  Medication Sig Dispense Refill   ACCU-CHEK AVIVA PLUS test strip      acetaminophen  (TYLENOL ) 325 MG tablet Take 2 tablets (650 mg total) by mouth every 6 (six) hours as needed for mild pain, moderate pain or headache. 60 tablet 0   Alcohol Swabs (ALCOHOL PREP) 70 % PADS      ASSURE COMFORT LANCETS 30G MISC      atorvastatin (LIPITOR) 20 MG tablet Take 20 mg by mouth daily.     Blood Glucose Calibration (ACCU-CHEK AVIVA) SOLN      Blood Glucose Monitoring Suppl (ACCU-CHEK AVIVA PLUS) W/DEVICE KIT      gabapentin (NEURONTIN) 300 MG capsule TAKE 1  CAPSULE BY MOUTH 2 TIMES DAILY 60 capsule 3   HUMALOG MIX 75/25 KWIKPEN (75-25) 100 UNIT/ML KwikPen Inject into the skin. 20 units  in the am and 10 units at night     Lacosamide  150 MG TABS Take 1 tablet (150 mg total) by mouth 2 (two) times daily. Take 1 tablet every morning 90 tablet 3   Lancet Devices (ADJUSTABLE LANCING DEVICE) MISC      levETIRAcetam  (KEPPRA ) 500 MG tablet Take 1 and 1/2 tablets twice a day 270 tablet 3   olmesartan -hydrochlorothiazide (BENICAR  HCT) 20-12.5 MG tablet Take 1 tablet by mouth daily. 90 tablet 1   OZEMPIC, 0.25 OR 0.5 MG/DOSE, 2 MG/3ML SOPN SMARTSIG:0.25 Milligram(s) SUB-Q Once a Week     Travoprost,  BAK Free, (TRAVATAN) 0.004 % SOLN ophthalmic solution 1 drop at bedtime.     aspirin  EC 81 MG tablet Take 1 tablet (81 mg total) by mouth daily. (Patient not taking: Reported on 12/10/2023) 30 tablet 0   No current facility-administered medications on file prior to visit.    ALLERGIES: Allergies  Allergen Reactions   Pregabalin Other (See Comments)    Zoned out, talking crazy   Topiramate Other (See Comments)    REACTION: caused inside seizures   Carbamazepine    Metronidazole Rash   Milnacipran Palpitations    FAMILY HISTORY: Family History  Problem Relation Age of Onset   Diabetes Mother    Hyperlipidemia Mother    Heart attack Father    Diabetes Sister    Breast cancer Sister    Diabetes Brother    Breast cancer Paternal Grandmother    Colon cancer Neg Hx     SOCIAL HISTORY: Social History   Socioeconomic History   Marital status: Divorced    Spouse name: Not on file   Number of children: Not on file   Years of education: Not on file   Highest education level: Not on file  Occupational History   Not on file  Tobacco Use   Smoking status: Never   Smokeless tobacco: Never  Vaping Use   Vaping status: Never Used  Substance and Sexual Activity   Alcohol use: No   Drug use: No   Sexual activity: Not on file  Other Topics Concern    Not on file  Social History Narrative   Right handed    One story home few steps   Lives with sister    1 can soda socially   Social Drivers of Health   Financial Resource Strain: Not at Risk (10/22/2023)   Received from [employer] Strain    How hard is it for you to pay for the very basics like food, housing, heating, medical care, and medications?: 1  Food Insecurity: Low Risk  (11/12/2023)   Received from [facility]   Hunger Vital Sign    Within the past 12 months, you worried that your food would run out before you got money to buy more: Never true    Within the past 12 months, the food you bought just didn't last and you didn't have money to get more. : Never true  Transportation Needs: No Transportation Needs (11/12/2023)   Received from [facility]    In the past 12 months, has lack of reliable transportation kept you from medical appointments, meetings, work or from getting things needed for daily living? : No  Physical Activity: At Risk (10/22/2023)   Received from [facility]   Physical Activity    Weekly Physical Activity: 2  Stress: Not at Risk (10/22/2023)   Received from [facility]   Stress    Do you feel these kinds of stress these days?: 1  Social Connections: Not at Risk (10/22/2023)   Received from [facility]   Social Connections    How often do you see or talk to people that you care about and feel close to? (For example: talking to friends on phone, visiting friends or family, going to church or club meetings): 1  Intimate Partner Violence: Not on file     PHYSICAL EXAM: Vitals:   12/10/23 1347  BP: 111/78  Pulse: 86  SpO2: 97%   General: No acute distress Head:  Normocephalic/atraumatic Skin/Extremities: No rash, slight  pitting edema on both LE L>R Neurological Exam: alert and awake. No aphasia or dysarthria. Fund of knowledge is appropriate. Attention and concentration are normal.   Cranial nerves: Pupils equal, round.  Extraocular movements intact with no nystagmus. Visual fields full.  No facial asymmetry.  Motor: Bulk and tone normal, muscle strength 5/5 throughout with no pronator drift. Reports left hand feels tight with burning causing decreased fine finger movements on left. Sensation decreased to cold and pin on left UE and LE. Reflexes brisk +2 throughout, no ankle clonus. Negative Hoffman sign. Toes down. Gait narrow-based and steady, no ataxia. Negative Tinel's sign at wrist, reports a sensation on Tinel's test at left elbow.    IMPRESSION: This is a 58 yo RH woman with a history of hypertension, diabetes, fibromyalgia, stroke in 1997, and seizures. She had described a metallic taste, staring/unresponsive episodes, gaps in time, as well as waking up with near-tongue bites. She has been told her EEG in the past was abnormal, her 48-hour EEG in 2021 and MRI brain were normal. Etiology of seizures unclear. She had self-reduced medications and reports episodes of decreased responsiveness. Increase Levetiracetam  to 750mg  BID, continue Lacosamide  150mg  daily. She was in the ER for left-sided paresthesias, MRI brain normal. We discussed CTA results showing intracranial atherosclerosis, continue control of vascular risk factors. She reports decreased sensation on the left, EMG/NCV of the left upper and lower extremities will be ordered. Continue Gabapentin. She is aware of [place] driving laws to stop driving after a seizure until 6 months seizure-free. Follow-up in 3 months, call for any changes.    Thank you for allowing me to participate in her care.  Please do not hesitate to call for any questions or concerns.   Darice Shivers, M.D.   CC: Annabella Rigg, Dr. Lang

## 2023-12-10 NOTE — Patient Instructions (Addendum)
 Good to see you.  Schedule EMG/NCV of the left upper and lower extremity  2. Continue with Gabapentin  3. Continue Vimpat  150mg  every morning, we will increase the Keppra  to 750mg  tablet: take 1 tablet twice a day  4. Follow-up in 3 months, call for any changes   Seizure Precautions: 1. If medication has been prescribed for you to prevent seizures, take it exactly as directed.  Do not stop taking the medicine without talking to your doctor first, even if you have not had a seizure in a long time.   2. Avoid activities in which a seizure would cause danger to yourself or to others.  Don't operate dangerous machinery, swim alone, or climb in high or dangerous places, such as on ladders, roofs, or girders.  Do not drive unless your doctor says you may.  3. If you have any warning that you may have a seizure, lay down in a safe place where you can't hurt yourself.    4.  No driving for 6 months from last seizure, as per [place]  state law.   Please refer to the following link on the Epilepsy Foundation of America's website for more information: http://www.epilepsyfoundation.org/answerplace/Social/driving/drivingu.cfm   5.  Maintain good sleep hygiene. Avoid alcohol.  6.  Contact your doctor if you have any problems that may be related to the medicine you are taking.  7.  Call 911 and bring the patient back to the ED if:        A.  The seizure lasts longer than 5 minutes.       B.  The patient doesn't awaken shortly after the seizure  C.  The patient has new problems such as difficulty seeing, speaking or moving  D.  The patient was injured during the seizure  E.  The patient has a temperature over 102 F (39C)  F.  The patient vomited and now is having trouble breathing

## 2023-12-23 ENCOUNTER — Ambulatory Visit
Admission: RE | Admit: 2023-12-23 | Discharge: 2023-12-23 | Disposition: A | Discharge status: Home / Self Care | Source: Ambulatory Visit | Attending: Family | Admitting: Family

## 2023-12-23 DIAGNOSIS — Z1231 Encounter for screening mammogram for malignant neoplasm of breast: Secondary | ICD-10-CM

## 2023-12-26 ENCOUNTER — Other Ambulatory Visit: Payer: Self-pay | Admitting: Family

## 2023-12-26 DIAGNOSIS — R928 Other abnormal and inconclusive findings on diagnostic imaging of breast: Secondary | ICD-10-CM

## 2024-01-07 ENCOUNTER — Other Ambulatory Visit: Payer: Self-pay | Admitting: Family

## 2024-01-07 ENCOUNTER — Ambulatory Visit
Admission: RE | Admit: 2024-01-07 | Discharge: 2024-01-07 | Disposition: A | Discharge status: Home / Self Care | Source: Ambulatory Visit | Attending: Family | Admitting: Family

## 2024-01-07 DIAGNOSIS — R928 Other abnormal and inconclusive findings on diagnostic imaging of breast: Secondary | ICD-10-CM

## 2024-01-07 DIAGNOSIS — N6489 Other specified disorders of breast: Secondary | ICD-10-CM

## 2024-01-14 ENCOUNTER — Other Ambulatory Visit: Payer: Self-pay | Admitting: Family

## 2024-01-14 DIAGNOSIS — N6489 Other specified disorders of breast: Secondary | ICD-10-CM

## 2024-01-22 ENCOUNTER — Other Ambulatory Visit: Payer: Self-pay | Admitting: Otolaryngology

## 2024-01-22 DIAGNOSIS — R1310 Dysphagia, unspecified: Secondary | ICD-10-CM

## 2024-02-06 ENCOUNTER — Encounter: Admitting: Neurology

## 2024-02-13 ENCOUNTER — Encounter: Admitting: Neurology

## 2024-02-14 ENCOUNTER — Ambulatory Visit: Payer: Self-pay | Admitting: Neurology

## 2024-02-14 ENCOUNTER — Ambulatory Visit (INDEPENDENT_AMBULATORY_CARE_PROVIDER_SITE_OTHER): Admitting: Neurology

## 2024-02-14 DIAGNOSIS — R200 Anesthesia of skin: Secondary | ICD-10-CM

## 2024-02-14 DIAGNOSIS — R202 Paresthesia of skin: Secondary | ICD-10-CM | POA: Diagnosis not present

## 2024-02-14 NOTE — Procedures (Signed)
 [facility]  [address] [place], [address]  [place], [place] [address] Tel: [phone] Fax: [phone] Test Date:  02/14/2024  Patient: Helen [name] DOB: 03-23-1966 Physician: Tonita Blanch, DO  Sex: Female Height: 5' 3.5 Ref Phys: Darice Shivers, MD  ID#: [id number]   Technician:    History: This is a 58 year old female referred for evaluation of left arm and leg paresthesias.  NCV & EMG Findings: Extensive electrodiagnostic testing of the left upper and lower extremity shows:  Left median, ulnar, mixed palmar, sural, and superficial peroneal sensory responses are within normal limits. Left median, ulnar, tibial, and peroneal motor responses are within normal limits. Left tibial H reflex studies within normal limits. There is no evidence of active or chronic motor axonal loss changes affecting any of the tested muscles.  Motor unit configuration and recruitment pattern is within normal limits.  Impression: This is a normal study of the left upper and lower extremities.  In particular, there is no evidence of a large fiber sensorimotor polyneuropathy, cervical/lumbosacral radiculopathy, or carpal tunnel syndrome.   ___________________________ Tonita Blanch, DO    Nerve Conduction Studies   Stim Site NR Peak (ms) Norm Peak (ms) O-P Amp (V) Norm O-P Amp  Left Median Anti Sensory (2nd Digit)  32 C  Wrist    3.6 <3.6 37.2 >15  Left Sup Peroneal Anti Sensory (Ant Lat Mall)  32 C  12 cm    3.3 <4.6 9.4 >4  Left Sural Anti Sensory (Lat Mall)  32 C  Calf    3.2 <4.6 12.0 >4  Left Ulnar Anti Sensory (5th Digit)  32 C  Wrist    3.1 <3.1 35.1 >10     Stim Site NR Onset (ms) Norm Onset (ms) O-P Amp (mV) Norm O-P Amp Site1 Site2 Delta-0 (ms) Dist (cm) Vel (m/s) Norm Vel (m/s)  Left Median Motor (Abd Poll Brev)  32 C  Wrist    3.3 <4.0 12.9 >6 Elbow Wrist 5.5 33.0 60 >50  Elbow    8.8  12.8         Left Peroneal Motor (Ext Dig Brev)  32 C  Ankle    3.4 <6.0 4.1  >2.5 B Fib Ankle 8.2 37.0 45 >40  B Fib    11.6  3.8  Poplt B Fib 1.4 7.0 50 >40  Poplt    13.0  3.8         Left Tibial Motor (Abd Hall Brev)  32 C  Ankle    5.0 <6.0 5.3 >4 Knee Ankle 7.5 44.0 59 >40  Knee    12.5  3.5         Left Ulnar Motor (Abd Dig Minimi)  32 C  Wrist    2.7 <3.1 8.7 >7 B Elbow Wrist 3.3 21.0 64 >50  B Elbow    6.0  8.5  A Elbow B Elbow 1.8 10.0 56 >50  A Elbow    7.8  8.2            Stim Site NR Peak (ms) Norm Peak (ms) P-T Amp (V) Site1 Site2 Delta-P (ms) Norm Delta (ms)  Left Median/Ulnar Palm Comparison (Wrist - 8cm)  32 C  Median Palm    2.2 <2.2 63.0 Median Palm Ulnar Palm 0.2   Ulnar Palm    2.0 <2.2 9.9       Electromyography   Side Muscle Ins.Act Fibs Fasc Recrt Amp Dur Poly Activation Comment  Left 1stDorInt Nml Nml Nml  Nml Nml Nml Nml Nml N/A  Left PronatorTeres Nml Nml Nml Nml Nml Nml Nml Nml N/A  Left Biceps Nml Nml Nml Nml Nml Nml Nml Nml N/A  Left Triceps Nml Nml Nml Nml Nml Nml Nml Nml N/A  Left Deltoid Nml Nml Nml Nml Nml Nml Nml Nml N/A  Left AntTibialis Nml Nml Nml Nml Nml Nml Nml Nml N/A  Left Gastroc Nml Nml Nml Nml Nml Nml Nml Nml N/A  Left Flex Dig Long Nml Nml Nml Nml Nml Nml Nml Nml N/A  Left RectFemoris Nml Nml Nml Nml Nml Nml Nml Nml N/A  Left GluteusMed Nml Nml Nml Nml Nml Nml Nml Nml N/A      Waveforms:

## 2024-02-25 ENCOUNTER — Ambulatory Visit: Admitting: Neurology

## 2024-03-01 LAB — COLOGUARD: COLOGUARD: NEGATIVE

## 2024-03-03 ENCOUNTER — Other Ambulatory Visit: Payer: Self-pay | Admitting: Neurology

## 2024-03-04 ENCOUNTER — Ambulatory Visit: Admitting: Neurology

## 2024-03-04 ENCOUNTER — Encounter: Payer: Self-pay | Admitting: Neurology

## 2024-03-04 VITALS — BP 118/81 | HR 85 | Ht 63.0 in | Wt 262.8 lb

## 2024-03-04 DIAGNOSIS — G40009 Localization-related (focal) (partial) idiopathic epilepsy and epileptic syndromes with seizures of localized onset, not intractable, without status epilepticus: Secondary | ICD-10-CM

## 2024-03-04 MED ORDER — LACOSAMIDE 150 MG PO TABS: 1.0000 | ORAL_TABLET | Freq: Every day | ORAL | 3 refills | Status: AC

## 2024-03-04 MED ORDER — LEVETIRACETAM 750 MG PO TABS: 750.0000 mg | ORAL_TABLET | Freq: Two times a day (BID) | ORAL | 3 refills | Status: AC

## 2024-03-04 NOTE — Patient Instructions (Signed)
 Good to see you doing better. Continue Levetiracetam  750mg  twice a day and Lacosamide  150mg  daily. Follow-up in 6 months, call for any changes.    Seizure Precautions: 1. If medication has been prescribed for you to prevent seizures, take it exactly as directed.  Do not stop taking the medicine without talking to your doctor first, even if you have not had a seizure in a long time.   2. Avoid activities in which a seizure would cause danger to yourself or to others.  Don't operate dangerous machinery, swim alone, or climb in high or dangerous places, such as on ladders, roofs, or girders.  Do not drive unless your doctor says you may.  3. If you have any warning that you may have a seizure, lay down in a safe place where you can't hurt yourself.    4.  No driving for 6 months from last seizure, as per [place]  state law.   Please refer to the following link on the Epilepsy Foundation of America's website for more information: http://www.epilepsyfoundation.org/answerplace/Social/driving/drivingu.cfm   5.  Maintain good sleep hygiene.   6.  Contact your doctor if you have any problems that may be related to the medicine you are taking.  7.  Call 911 and bring the patient back to the ED if:        A.  The seizure lasts longer than 5 minutes.       B.  The patient doesn't awaken shortly after the seizure  C.  The patient has new problems such as difficulty seeing, speaking or moving  D.  The patient was injured during the seizure  E.  The patient has a temperature over 102 F (39C)  F.  The patient vomited and now is having trouble breathing

## 2024-03-04 NOTE — Progress Notes (Unsigned)
 NEUROLOGY FOLLOW UP OFFICE NOTE  Helen [name] [id number] 03-05-1966  HISTORY OF PRESENT ILLNESS: I had the pleasure of seeing Helen [name] in follow-up in the neurology clinic on 03/04/2024.  The patient was last seen 3 months ago for seizures. She is alone in the office today. Records and images were personally reviewed where available.  On her last visit, she reported self-reducing medications and having episodes of unresponsiveness. She was advised take medication as prescribed (Levetiracetam  750mg  BID), she is on Lacosamide  150mg  daily (prescribed as BID). She was in the ER 11/2023 for left-sided numbness and heaviness. MRI brain without contrast which did not show any acute changes, normal brain MRI. 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 She had an EMG/NCV of the left arm and leg 11/2023 which was normal. She is on Gabapentin 300mg  BID prescribed by Ortho.  6-7hrs Mood okay Driving    She had self-reduced medications and reports episodes of decreased responsiveness. Increase Levetiracetam  to 750mg  BID, continue Lacosamide  150mg  daily. She was in the ER for left-sided paresthesias, MRI brain normal. We discussed CTA results showing intracranial atherosclerosis, continue control of vascular risk factors. She reports decreased sensation on the left, EMG/NCV of the left upper and lower extremities will be ordered. Continue Gabapentin.  had the pleasure of seeing Helen [name] in follow-up in the neurology clinic on 12/10/2023.  The patient was last seen 5 months ago for seizures. She is alone in the office today. Records and images were personally reviewed where available.  She presents for evaluation after ER visit on 9/14 for left-sided numbness. She recalls her left arm started getting heavy and tight. She felt numbness in the left shoulder and burning in her left hand. The lateral upper arm was numb, there was tingling on her left 5th digit and big toe, and  burning in her left palm. She was washing her hands and looking in the mirror when she noticed her left arm was swollen. She had pressure on the back of her head on the left. No shoulder pain. She denies any recent head injuries, falls, or heavy lifting. I personally reviewed MRI brain without contrast which did not show any acute changes, normal brain MRI. 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 She has neck and back pain and used to get injections. She restarted Gabapentin yesterday.   As far as she knows, she has not had any seizures. She has an aide who has not mentioned any staring episodes. A couple of times she would be talking on the phone and her friend asks where I go to. This has happened a few times in the past week or so. She is on Lacosamide  150mg  (instead of BID) every morning and Levetiracetam  500mg  BID (instead of 1.5 tabs BID) without side effects.     History on Initial Assessment 05/26/2019: This is a 58 year old right-handed woman with a history of hypertension, diabetes, fibromyalgia, stroke in 1997, presenting for evaluation of seizures. 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 She was having inside seizures, waking up in the morning and the cover was shaking. She has been seeing neurologist Dr. Euna, and when she first started seeing him, her mother reported staring/unresponsive episodes. She reports there was something showing up on the brain wave test, and she was started on Vimpat  and Keppra   at the same time in 2013 or 2014. She is not sure if the episodes quieted down because she is mostly alone at home. She has a CNA coming daily  who tells her she is not responding, last episode was a few days ago. The CNA tells here mostly everyday that something happened. She has occasional episodes of a metallic taste in her mouth. Sometimes she comes to with her  teeth down on her tongue, unsure how it happened. No incontinence. She has occasional twitching in her eyes, either leg or arm. One time in church last month, both hands started trembling and she could not stop them. There was a prickly sensation at the tip of her fingers that lasted more than 15 seconds. She could understand things around her but her body felt weird. There was slight dizziness and a hot/cold sensation. She lives with her sister and would be told that she does not answer/hear them. She has gaps in time all the time. When driving, she does not remember passing certain places. When she laughs too hard, she can get lightheaded and things go black, then she wakes up on the floor. This happened a couple of weeks ago while she was sitting. She thinks she has seizures in her sleep, she wakes up in the morning with her teeth on her tongue. She ran out of Keppra  and Vimpat  3 months ago and feels she is having more of the night events.  She has had headaches for several years with pain on the vertex and right side occurring around twice a week. No associated nausea/vomiting. There are some flickering lights. Tylenol  helps. She has intermittent blurred vision that her eye doctor thinks is related to fibromyalgia. She was diagnosed with fibromyalgia in 2006, she has very sensitive skin even to light touch, in constant pain 24/7. When really bad, she needs help eating because she can barely lift her arms up. Her left arm has been in constant severe daily pain the past month, which has affected her sleep. She has been told she has carpal tunnel syndrome on the left hand 5 years ago. She was getting injections in her neck and back. She takes gabapentin once a day, and is not aware she was told to take it BID. She denies being under a lot of stress.   Epilepsy Risk Factors:  She had a normal birth and early development.  There is no history of febrile convulsions, CNS infections such as  meningitis/encephalitis, significant traumatic brain injury, neurosurgical procedures, or family history of seizures.  Diagnostic Data:  MRI brain with and without contrast done 06/2019 which was normal, hippocampi symmetric with no abnormal signal or enhancement seen. Her routine and 48-hour EEG in 05/2019 was normal, however typical events were not captured.   PAST MEDICAL HISTORY: Past Medical History:  Diagnosis Date   Acid reflux    Anemia    Anxiety    Chronic headaches    Degenerative disc disease    lumbar   Diabetes mellitus without complication (HCC)    Fibromyalgia    Heart murmur    Hypertension    Scoliosis    Seizures (HCC)    Stroke (HCC) 1997    MEDICATIONS: Current Outpatient Medications on File Prior to Visit  Medication Sig Dispense Refill   ACCU-CHEK AVIVA PLUS test strip      acetaminophen  (TYLENOL ) 325 MG tablet Take 2 tablets (650 mg total) by mouth every 6 (six) hours as needed for mild pain, moderate  pain or headache. 60 tablet 0   Alcohol Swabs (ALCOHOL PREP) 70 % PADS      ASSURE COMFORT LANCETS 30G MISC      atorvastatin (LIPITOR) 20 MG tablet Take 20 mg by mouth daily.     Blood Glucose Calibration (ACCU-CHEK AVIVA) SOLN      Blood Glucose Monitoring Suppl (ACCU-CHEK AVIVA PLUS) W/DEVICE KIT      HUMALOG MIX 75/25 KWIKPEN (75-25) 100 UNIT/ML KwikPen Inject into the skin. 20 units  in the am and 10 units at night     Lacosamide  150 MG TABS TAKE 1 TABLET BY MOUTH EVERY DAY 90 tablet 3   Lancet Devices (ADJUSTABLE LANCING DEVICE) MISC      levETIRAcetam  (KEPPRA ) 750 MG tablet Take 1 tablet (750 mg total) by mouth 2 (two) times daily. 180 tablet 3   olmesartan -hydrochlorothiazide (BENICAR  HCT) 20-12.5 MG tablet Take 1 tablet by mouth daily. 90 tablet 1   OZEMPIC, 0.25 OR 0.5 MG/DOSE, 2 MG/3ML SOPN SMARTSIG:0.25 Milligram(s) SUB-Q Once a Week     aspirin  EC 81 MG tablet Take 1 tablet (81 mg total) by mouth daily. (Patient not taking: Reported on  03/04/2024) 30 tablet 0   gabapentin (NEURONTIN) 300 MG capsule TAKE 1 CAPSULE BY MOUTH 2 TIMES DAILY 60 capsule 3   Travoprost, BAK Free, (TRAVATAN) 0.004 % SOLN ophthalmic solution 1 drop at bedtime. (Patient not taking: Reported on 03/04/2024)     No current facility-administered medications on file prior to visit.    ALLERGIES: Allergies  Allergen Reactions   Pregabalin Other (See Comments)    Zoned out, talking crazy   Topiramate Other (See Comments)    REACTION: caused inside seizures   Carbamazepine    Metronidazole Rash   Milnacipran Palpitations    FAMILY HISTORY: Family History  Problem Relation Age of Onset   Diabetes Mother    Hyperlipidemia Mother    Heart attack Father    Diabetes Sister    Breast cancer Sister    Diabetes Brother    Breast cancer Paternal Grandmother    Colon cancer Neg Hx     SOCIAL HISTORY: Social History   Socioeconomic History   Marital status: Divorced    Spouse name: Not on file   Number of children: Not on file   Years of education: Not on file   Highest education level: Not on file  Occupational History   Not on file  Tobacco Use   Smoking status: Never   Smokeless tobacco: Never  Vaping Use   Vaping status: Never Used  Substance and Sexual Activity   Alcohol use: No   Drug use: No   Sexual activity: Not on file  Other Topics Concern   Not on file  Social History Narrative   Right handed    One story home few steps   Lives with sister    1 can soda socially   Social Drivers of Health   Financial Resource Strain: Not at Risk (10/22/2023)   Received from [facility] Strain    How hard is it for you to pay for the very basics like food, housing, heating, medical care, and medications?: 1  Food Insecurity: Low Risk (11/12/2023)   Received from [facility]   Hunger Vital Sign    Within the past 12 months, you worried that your food would run out before you got money to buy more: Never true     Within the past 12 months, the  food you bought just didn't last and you didn't have money to get more. : Never true  Transportation Needs: No Transportation Needs (11/12/2023)   Received from [facility]   Transportation    In the past 12 months, has lack of reliable transportation kept you from medical appointments, meetings, work or from getting things needed for daily living? : No  Physical Activity: At Risk (10/22/2023)   Received from [facility]   Physical Activity    Weekly Physical Activity: 2  Stress: Not at Risk (10/22/2023)   Received from [facility]   Stress    Do you feel these kinds of stress these days?: 1  Social Connections: Not at Risk (10/22/2023)   Received from [facility]   Social Connections    How often do you see or talk to people that you care about and feel close to? (For example: talking to friends on phone, visiting friends or family, going to church or club meetings): 1  Intimate Partner Violence: Not on file     PHYSICAL EXAM: Vitals:   03/04/24 1427  BP: 118/81  Pulse: 85  SpO2: 95%   General: No acute distress Head:  Normocephalic/atraumatic Skin/Extremities: No rash, no edema Neurological Exam: alert and oriented to person, place, and time. No aphasia or dysarthria. Fund of knowledge is appropriate.  Recent and remote memory are intact.  Attention and concentration are normal.   Cranial nerves: Pupils equal, round. Extraocular movements intact with no nystagmus. Visual fields full.  No facial asymmetry.  Motor: Bulk and tone normal, muscle strength 5/5 throughout with no pronator drift.   Finger to nose testing intact.  Gait narrow-based and steady, able to tandem walk adequately.  Romberg negative.   IMPRESSION: This is a 58 yo RH woman with a history of hypertension, diabetes, fibromyalgia, stroke in 1997, and seizures. She had described a metallic taste, staring/unresponsive episodes, gaps in time, as well as waking up with near-tongue bites. She has been told her  EEG in the past was abnormal, her 48-hour EEG in 2021 and MRI brain were normal. Etiology of seizures unclear. She had self-reduced medications and reports episodes of decreased responsiveness. Increase Levetiracetam  to 750mg  BID, continue Lacosamide  150mg  daily. She was in the ER for left-sided paresthesias, MRI brain normal. We discussed CTA results showing intracranial atherosclerosis, continue control of vascular risk factors. She reports decreased sensation on the left, EMG/NCV of the left upper and lower extremities will be ordered. Continue Gabapentin. She is aware of [place] driving laws to stop driving after a seizure until 6 months seizure-free. Follow-up in 3 months, call for any changes.     Thank you for allowing me to participate in *** care.  Please do not hesitate to call for any questions or concerns.  The duration of this appointment visit was *** minutes of face-to-face time with the patient.  Greater than 50% of this time was spent in counseling, explanation of diagnosis, planning of further management, and coordination of care.   Darice Shivers, M.D.   CC: ***

## 2024-03-05 ENCOUNTER — Inpatient Hospital Stay
Admission: RE | Admit: 2024-03-05 | Discharge: 2024-03-05 | Disposition: A | Source: Ambulatory Visit | Attending: Otolaryngology

## 2024-03-05 DIAGNOSIS — R1310 Dysphagia, unspecified: Secondary | ICD-10-CM

## 2024-03-08 ENCOUNTER — Encounter: Payer: Self-pay | Admitting: Neurology

## 2024-07-09 ENCOUNTER — Encounter

## 2024-07-09 ENCOUNTER — Other Ambulatory Visit

## 2024-09-18 ENCOUNTER — Ambulatory Visit: Admitting: Neurology
